# Patient Record
Sex: Male | Born: 1995 | Race: Black or African American | Hispanic: No | Marital: Single | Smoking: Current every day smoker
Health system: Southern US, Community
[De-identification: ages and names within clinical notes are randomized; demographics above are authoritative.]

## PROBLEM LIST (undated history)

## (undated) HISTORY — PX: FRACTURE SURGERY: SHX138

---

## 2016-08-17 HISTORY — PX: PILONIDAL CYST / SINUS EXCISION: SUR543

## 2020-07-30 ENCOUNTER — Emergency Department (HOSPITAL_COMMUNITY)
Admission: EM | Admit: 2020-07-30 | Discharge: 2020-07-30 | Disposition: A | Discharge status: Home / Self Care | Payer: BLUE CROSS/BLUE SHIELD | Attending: Emergency Medicine | Admitting: Emergency Medicine

## 2020-07-30 ENCOUNTER — Encounter (HOSPITAL_COMMUNITY): Payer: Self-pay | Admitting: Emergency Medicine

## 2020-07-30 ENCOUNTER — Other Ambulatory Visit: Payer: Self-pay

## 2020-07-30 DIAGNOSIS — L0231 Cutaneous abscess of buttock: Secondary | ICD-10-CM | POA: Diagnosis present

## 2020-07-30 DIAGNOSIS — L0501 Pilonidal cyst with abscess: Secondary | ICD-10-CM | POA: Insufficient documentation

## 2020-07-30 DIAGNOSIS — Z20822 Contact with and (suspected) exposure to covid-19: Secondary | ICD-10-CM | POA: Diagnosis not present

## 2020-07-30 DIAGNOSIS — Z5321 Procedure and treatment not carried out due to patient leaving prior to being seen by health care provider: Secondary | ICD-10-CM | POA: Diagnosis not present

## 2020-07-30 NOTE — ED Notes (Signed)
Patient called x1 for vitals recheck with no response 

## 2020-07-30 NOTE — ED Notes (Signed)
Patient called x 2 with no response. 

## 2020-07-30 NOTE — ED Triage Notes (Signed)
Patient arrived with EMS from home reports worsening abscess at upper buttocks with swelling and drainage onset last week .

## 2020-07-31 ENCOUNTER — Emergency Department (HOSPITAL_COMMUNITY): Payer: BLUE CROSS/BLUE SHIELD

## 2020-07-31 ENCOUNTER — Other Ambulatory Visit: Payer: Self-pay

## 2020-07-31 ENCOUNTER — Ambulatory Visit (HOSPITAL_COMMUNITY)
Admission: EM | Admit: 2020-07-31 | Discharge: 2020-07-31 | Disposition: A | Discharge status: Home / Self Care | Payer: BLUE CROSS/BLUE SHIELD | Attending: Emergency Medicine | Admitting: Emergency Medicine

## 2020-07-31 ENCOUNTER — Emergency Department (HOSPITAL_COMMUNITY): Payer: BLUE CROSS/BLUE SHIELD | Admitting: Anesthesiology

## 2020-07-31 ENCOUNTER — Encounter (HOSPITAL_COMMUNITY): Payer: Self-pay | Admitting: Emergency Medicine

## 2020-07-31 ENCOUNTER — Encounter (HOSPITAL_COMMUNITY)
Admission: EM | Disposition: A | Discharge status: Home / Self Care | Payer: Self-pay | Source: Home / Self Care | Attending: Emergency Medicine

## 2020-07-31 DIAGNOSIS — L0591 Pilonidal cyst without abscess: Secondary | ICD-10-CM | POA: Diagnosis not present

## 2020-07-31 DIAGNOSIS — L0501 Pilonidal cyst with abscess: Secondary | ICD-10-CM

## 2020-07-31 HISTORY — PX: PILONIDAL CYST EXCISION: SHX744

## 2020-07-31 LAB — CBC WITH DIFFERENTIAL/PLATELET
Abs Immature Granulocytes: 0.02 10*3/uL (ref 0.00–0.07)
Basophils Absolute: 0 10*3/uL (ref 0.0–0.1)
Basophils Relative: 0 %
Eosinophils Absolute: 0.1 10*3/uL (ref 0.0–0.5)
Eosinophils Relative: 1 %
HCT: 46.7 % (ref 39.0–52.0)
Hemoglobin: 15.2 g/dL (ref 13.0–17.0)
Immature Granulocytes: 0 %
Lymphocytes Relative: 20 %
Lymphs Abs: 1.8 10*3/uL (ref 0.7–4.0)
MCH: 29.6 pg (ref 26.0–34.0)
MCHC: 32.5 g/dL (ref 30.0–36.0)
MCV: 90.9 fL (ref 80.0–100.0)
Monocytes Absolute: 0.6 10*3/uL (ref 0.1–1.0)
Monocytes Relative: 7 %
Neutro Abs: 6.4 10*3/uL (ref 1.7–7.7)
Neutrophils Relative %: 72 %
Platelets: 269 10*3/uL (ref 150–400)
RBC: 5.14 MIL/uL (ref 4.22–5.81)
RDW: 12.4 % (ref 11.5–15.5)
WBC: 9 10*3/uL (ref 4.0–10.5)
nRBC: 0 % (ref 0.0–0.2)

## 2020-07-31 LAB — COMPREHENSIVE METABOLIC PANEL
ALT: 21 U/L (ref 0–44)
AST: 19 U/L (ref 15–41)
Albumin: 4.5 g/dL (ref 3.5–5.0)
Alkaline Phosphatase: 45 U/L (ref 38–126)
Anion gap: 8 (ref 5–15)
BUN: 10 mg/dL (ref 6–20)
CO2: 29 mmol/L (ref 22–32)
Calcium: 9.3 mg/dL (ref 8.9–10.3)
Chloride: 99 mmol/L (ref 98–111)
Creatinine, Ser: 0.99 mg/dL (ref 0.61–1.24)
GFR, Estimated: >60 mL/min (ref >60)
Glucose, Bld: 105 mg/dL — ABNORMAL HIGH (ref 70–99)
Potassium: 3.8 mmol/L (ref 3.5–5.1)
Sodium: 136 mmol/L (ref 135–145)
Total Bilirubin: 0.7 mg/dL (ref 0.3–1.2)
Total Protein: 8 g/dL (ref 6.5–8.1)

## 2020-07-31 LAB — RESP PANEL BY RT-PCR (FLU A&B, COVID) ARPGX2
Influenza A by PCR: NEGATIVE
Influenza B by PCR: NEGATIVE
SARS Coronavirus 2 by RT PCR: NEGATIVE

## 2020-07-31 SURGERY — EXCISION, SIMPLE PILONIDAL CYST
Anesthesia: General

## 2020-07-31 MED ORDER — CHLORHEXIDINE GLUCONATE CLOTH 2 % EX PADS: 6.0000 | MEDICATED_PAD | Freq: Once | CUTANEOUS | Status: DC

## 2020-07-31 MED ORDER — IOHEXOL 300 MG/ML  SOLN
100.0000 mL | Freq: Once | INTRAMUSCULAR | Status: AC | PRN
  Administered 2020-07-31: 04:00:00 100 mL via INTRAVENOUS

## 2020-07-31 MED ORDER — PROPOFOL 10 MG/ML IV BOLUS
INTRAVENOUS | Status: AC
  Filled 2020-07-31: qty 20

## 2020-07-31 MED ORDER — SODIUM CHLORIDE 0.9 % IR SOLN
Status: DC | PRN
  Administered 2020-07-31: 1000 mL

## 2020-07-31 MED ORDER — ONDANSETRON HCL 4 MG/2ML IJ SOLN
INTRAMUSCULAR | Status: AC
  Filled 2020-07-31: qty 2

## 2020-07-31 MED ORDER — MIDAZOLAM HCL 5 MG/5ML IJ SOLN
INTRAMUSCULAR | Status: DC | PRN
  Administered 2020-07-31: 2 mg via INTRAVENOUS

## 2020-07-31 MED ORDER — LIDOCAINE HCL (CARDIAC) PF 50 MG/5ML IV SOSY
PREFILLED_SYRINGE | INTRAVENOUS | Status: DC | PRN
  Administered 2020-07-31: 60 mg via INTRAVENOUS

## 2020-07-31 MED ORDER — EPHEDRINE 5 MG/ML INJ
INTRAVENOUS | Status: AC
  Filled 2020-07-31: qty 10

## 2020-07-31 MED ORDER — LACTATED RINGERS IV SOLN: INTRAVENOUS | Status: DC

## 2020-07-31 MED ORDER — FENTANYL CITRATE (PF) 250 MCG/5ML IJ SOLN
INTRAMUSCULAR | Status: AC
  Filled 2020-07-31: qty 5

## 2020-07-31 MED ORDER — HYDROGEN PEROXIDE 3 % EX SOLN
CUTANEOUS | Status: DC | PRN
  Administered 2020-07-31: 1

## 2020-07-31 MED ORDER — CHLORHEXIDINE GLUCONATE 0.12 % MT SOLN: 15.0000 mL | Freq: Once | OROMUCOSAL | Status: DC

## 2020-07-31 MED ORDER — POVIDONE-IODINE 10 % EX SOLN
CUTANEOUS | Status: AC
  Filled 2020-07-31: qty 15

## 2020-07-31 MED ORDER — BUPIVACAINE LIPOSOME 1.3 % IJ SUSP
INTRAMUSCULAR | Status: AC
  Filled 2020-07-31: qty 20

## 2020-07-31 MED ORDER — KETOROLAC TROMETHAMINE 30 MG/ML IJ SOLN: 30.0000 mg | Freq: Once | INTRAMUSCULAR | Status: DC

## 2020-07-31 MED ORDER — ONDANSETRON HCL 4 MG/2ML IJ SOLN
INTRAMUSCULAR | Status: DC | PRN
  Administered 2020-07-31: 4 mg via INTRAVENOUS

## 2020-07-31 MED ORDER — ORAL CARE MOUTH RINSE: 15.0000 mL | Freq: Once | OROMUCOSAL | Status: DC

## 2020-07-31 MED ORDER — HYDROMORPHONE HCL 1 MG/ML IJ SOLN: 0.2500 mg | INTRAMUSCULAR | Status: DC | PRN

## 2020-07-31 MED ORDER — LIDOCAINE-EPINEPHRINE (PF) 1 %-1:200000 IJ SOLN
10.0000 mL | Freq: Once | INTRAMUSCULAR | Status: AC
  Administered 2020-07-31: 10 mL via INTRADERMAL
  Filled 2020-07-31: qty 30

## 2020-07-31 MED ORDER — FENTANYL CITRATE (PF) 100 MCG/2ML IJ SOLN
INTRAMUSCULAR | Status: DC | PRN
  Administered 2020-07-31 (×4): 50 ug via INTRAVENOUS
  Administered 2020-07-31 (×2): 25 ug via INTRAVENOUS

## 2020-07-31 MED ORDER — OXYCODONE HCL 5 MG PO TABS: 5.0000 mg | ORAL_TABLET | ORAL | 0 refills | Status: AC | PRN

## 2020-07-31 MED ORDER — ONDANSETRON HCL 4 MG PO TABS: 4.0000 mg | ORAL_TABLET | Freq: Three times a day (TID) | ORAL | 1 refills | Status: AC | PRN

## 2020-07-31 MED ORDER — EPHEDRINE SULFATE 50 MG/ML IJ SOLN
INTRAMUSCULAR | Status: DC | PRN
  Administered 2020-07-31 (×2): 10 mg via INTRAVENOUS

## 2020-07-31 MED ORDER — MIDAZOLAM HCL 2 MG/2ML IJ SOLN
INTRAMUSCULAR | Status: AC
  Filled 2020-07-31: qty 2

## 2020-07-31 MED ORDER — DOCUSATE SODIUM 100 MG PO CAPS: 100.0000 mg | ORAL_CAPSULE | Freq: Two times a day (BID) | ORAL | 2 refills | Status: AC | PRN

## 2020-07-31 MED ORDER — LIDOCAINE HCL (PF) 2 % IJ SOLN
INTRAMUSCULAR | Status: AC
  Filled 2020-07-31: qty 10

## 2020-07-31 MED ORDER — MORPHINE SULFATE (PF) 2 MG/ML IV SOLN
2.0000 mg | INTRAVENOUS | Status: DC | PRN
Start: 2020-07-31 — End: 2020-07-31
  Administered 2020-07-31: 2 mg via INTRAVENOUS
  Filled 2020-07-31: qty 1

## 2020-07-31 MED ORDER — MORPHINE SULFATE (PF) 4 MG/ML IV SOLN
4.0000 mg | Freq: Once | INTRAVENOUS | Status: AC
Start: 2020-07-31 — End: 2020-07-31
  Administered 2020-07-31: 02:00:00 4 mg via INTRAMUSCULAR
  Filled 2020-07-31: qty 1

## 2020-07-31 MED ORDER — DEXAMETHASONE SODIUM PHOSPHATE 10 MG/ML IJ SOLN
INTRAMUSCULAR | Status: AC
  Filled 2020-07-31: qty 1

## 2020-07-31 MED ORDER — ONDANSETRON HCL 4 MG/2ML IJ SOLN: 4.0000 mg | Freq: Once | INTRAMUSCULAR | Status: DC | PRN

## 2020-07-31 MED ORDER — BUPIVACAINE LIPOSOME 1.3 % IJ SUSP
INTRAMUSCULAR | Status: DC | PRN
  Administered 2020-07-31: 20 mL

## 2020-07-31 MED ORDER — PROPOFOL 10 MG/ML IV BOLUS
INTRAVENOUS | Status: DC | PRN
  Administered 2020-07-31: 200 mg via INTRAVENOUS

## 2020-07-31 MED ORDER — DEXAMETHASONE SODIUM PHOSPHATE 4 MG/ML IJ SOLN
INTRAMUSCULAR | Status: DC | PRN
  Administered 2020-07-31: 10 mg via INTRAVENOUS

## 2020-07-31 MED ORDER — DOXYCYCLINE HYCLATE 50 MG PO CAPS: 50.0000 mg | ORAL_CAPSULE | Freq: Two times a day (BID) | ORAL | 0 refills | Status: AC

## 2020-07-31 SURGICAL SUPPLY — 32 items
CANNULA VESSEL 3MM 2 BLNT TIP (CANNULA) ×6 IMPLANT
CLOTH BEACON ORANGE TIMEOUT ST (SAFETY) ×3 IMPLANT
COVER LIGHT HANDLE STERIS (MISCELLANEOUS) ×6 IMPLANT
COVER WAND RF STERILE (DRAPES) ×3 IMPLANT
ELECT REM PT RETURN 9FT ADLT (ELECTROSURGICAL) ×3
ELECTRODE REM PT RTRN 9FT ADLT (ELECTROSURGICAL) ×1 IMPLANT
GAUZE PACKING 2X5 YD STRL (GAUZE/BANDAGES/DRESSINGS) ×3 IMPLANT
GAUZE SPONGE 4X4 12PLY STRL (GAUZE/BANDAGES/DRESSINGS) ×6 IMPLANT
GLOVE BIO SURGEON STRL SZ 6.5 (GLOVE) ×2 IMPLANT
GLOVE BIO SURGEONS STRL SZ 6.5 (GLOVE) ×1
GLOVE BIOGEL PI IND STRL 6.5 (GLOVE) ×1 IMPLANT
GLOVE BIOGEL PI IND STRL 7.0 (GLOVE) ×2 IMPLANT
GLOVE BIOGEL PI INDICATOR 6.5 (GLOVE) ×2
GLOVE BIOGEL PI INDICATOR 7.0 (GLOVE) ×4
GOWN STRL REUS W/TWL LRG LVL3 (GOWN DISPOSABLE) ×6 IMPLANT
KIT TURNOVER KIT A (KITS) ×3 IMPLANT
MANIFOLD NEPTUNE II (INSTRUMENTS) ×3 IMPLANT
NEEDLE HYPO 21X1.5 SAFETY (NEEDLE) ×3 IMPLANT
NS IRRIG 1000ML POUR BTL (IV SOLUTION) ×3 IMPLANT
PACK MINOR (CUSTOM PROCEDURE TRAY) ×3 IMPLANT
PAD ABD 5X9 TENDERSORB (GAUZE/BANDAGES/DRESSINGS) ×6 IMPLANT
PAD ARMBOARD 7.5X6 YLW CONV (MISCELLANEOUS) ×3 IMPLANT
PENCIL SMOKE EVACUATOR (MISCELLANEOUS) ×3 IMPLANT
SET BASIN LINEN APH (SET/KITS/TRAYS/PACK) ×3 IMPLANT
SPONGE LAP 18X18 RF (DISPOSABLE) ×3 IMPLANT
SUT ETHILON 3 0 FSL (SUTURE) IMPLANT
SUT PROLENE 2 0 FS (SUTURE) IMPLANT
SWAB CULTURE ESWAB REG 1ML (MISCELLANEOUS) ×3 IMPLANT
SWAB CULTURE LIQ STUART DBL (MISCELLANEOUS) ×3 IMPLANT
SYR 20ML LL LF (SYRINGE) ×6 IMPLANT
SYR CONTROL 10ML LL (SYRINGE) ×6 IMPLANT
TOWEL OR 17X26 4PK STRL BLUE (TOWEL DISPOSABLE) ×3 IMPLANT

## 2020-07-31 NOTE — Anesthesia Postprocedure Evaluation (Signed)
Anesthesia Post Note  Patient: Kayhan Boardley  Procedure(s) Performed: CYST EXCISION PILONIDAL SIMPLE (N/A )  Patient location during evaluation: PACU Anesthesia Type: General Level of consciousness: awake, patient cooperative and responds to stimulation Pain management: pain level controlled Vital Signs Assessment: post-procedure vital signs reviewed and stable Respiratory status: spontaneous breathing, respiratory function stable and nonlabored ventilation Cardiovascular status: blood pressure returned to baseline and stable Postop Assessment: no headache and no backache Anesthetic complications: no   No complications documented.   Last Vitals:  Vitals:   07/31/20 0748 07/31/20 0935  BP: 127/71 114/71  Pulse: 64 73  Resp: 18 12  Temp: (!) 36.4 C 36.8 C  SpO2: 97% 100%    Last Pain:  Vitals:   07/31/20 0935  TempSrc: Oral  PainSc: 10-Worst pain ever                 Brynda Peon

## 2020-07-31 NOTE — ED Triage Notes (Signed)
Pt c/o abscess to buttocks that has drainage and swelling for the past week.

## 2020-07-31 NOTE — H&P (Signed)
[facility] History and Physical  Reason for Referral: Infected pilonidal cyst  Referring Physician:  Dr. Lynelle DoctorKnapp  Chief Complaint    Abscess      Shaun Cox is a 24 y.o. male.  HPI:  Shaun Cox is a 24 yo otherwise healthy patient who comes in with pain and drainage in the gluteal cleft area. He has prior history of pilonidal disease and prior infections requiring drainage and excision in the operating room about 3 years ago in [place]. He reports that he had a large 9cm open wound that had to be packed and took months to heal.  He has not had issues since that time but started having pain yesterday.    Dr. Lynelle DoctorKnapp attempted an I&D at the bedside but only got out blood. CT demonstrates a collection midline just above the coccyx and significant inflammatory stranding superior.  He denies any fevers or chills.    History reviewed. No pertinent past medical history.  Past Surgical History:  Procedure Laterality Date  . PILONIDAL CYST / SINUS EXCISION  2018   [place]     Family History  Problem Relation Age of Onset  . Hypertension Mother     Social History   Tobacco Use  . Smoking status: Never Smoker  . Smokeless tobacco: Never Used  Substance Use Topics  . Alcohol use: Never  . Drug use: Never    Medications: I have reviewed the patient's current medications. Current Facility-Administered Medications  Medication Dose Route Frequency Provider Last Rate Last Admin  . morphine 2 MG/ML injection 2 mg  2 mg Intravenous Q2H PRN Lucretia RoersBridges, Maximiliano Cromartie C, MD   2 mg at 07/31/20 0809  . povidone-iodine (BETADINE) 10 % external solution            No current outpatient medications on file.   Allergies  Allergen Reactions  . Amoxicillin       ROS:  A comprehensive review of systems was negative except for: Genitourinary: positive for pain and drainage from pilonidal cyst   Blood pressure 127/71, pulse 64, temperature (!) 97.5 F (36.4 C), temperature  source Oral, resp. rate 18, height 5\' 9"  (1.753 m), weight 97.5 kg, SpO2 97 %. Physical Exam Vitals reviewed.  Constitutional:      Appearance: He is normal weight.  HENT:     Head: Normocephalic.     Nose: Nose normal.     Mouth/Throat:     Mouth: Mucous membranes are moist.  Eyes:     Extraocular Movements: Extraocular movements intact.  Cardiovascular:     Rate and Rhythm: Normal rate and regular rhythm.  Pulmonary:     Effort: Pulmonary effort is normal.     Breath sounds: Normal breath sounds.  Abdominal:     General: There is no distension.     Palpations: Abdomen is soft.     Tenderness: There is no abdominal tenderness.  Genitourinary:    Comments: Gluteal cleft region with hair and tufts of air in pits consistent with recurrent pilonidal, tufts removed and demonstrated to girlfriend, swollen and tender area midline just above the remaining pits that are about 4cm from the anal verge  Musculoskeletal:        General: Normal range of motion.     Cervical back: Normal range of motion.  Skin:    General: Skin is warm.  Neurological:     General: No focal deficit present.     Mental Status: He is alert and oriented to person,  place, and time.  Psychiatric:        Mood and Affect: Mood normal.        Behavior: Behavior normal.        Thought Content: Thought content normal.        Judgment: Judgment normal.     Results: Results for orders placed or performed during the hospital encounter of 07/31/20 (from the past 48 hour(s))  Comprehensive metabolic panel     Status: Abnormal   Collection Time: 07/31/20  3:52 AM  Result Value Ref Range   Sodium 136 135 - 145 mmol/L   Potassium 3.8 3.5 - 5.1 mmol/L   Chloride 99 98 - 111 mmol/L   CO2 29 22 - 32 mmol/L   Glucose, Bld 105 (H) 70 - 99 mg/dL    Comment: Glucose reference range applies only to samples taken after fasting for at least 8 hours.   BUN 10 6 - 20 mg/dL   Creatinine, Ser 2.99 0.61 - 1.24 mg/dL   Calcium  9.3 8.9 - 24.2 mg/dL   Total Protein 8.0 6.5 - 8.1 g/dL   Albumin 4.5 3.5 - 5.0 g/dL   AST 19 15 - 41 U/L   ALT 21 0 - 44 U/L   Alkaline Phosphatase 45 38 - 126 U/L   Total Bilirubin 0.7 0.3 - 1.2 mg/dL   GFR, Estimated >68 >34 mL/min    Comment: (NOTE) Calculated using the CKD-EPI Creatinine Equation (2021)    Anion gap 8 5 - 15    Comment: Performed at [facility], [address], [place], [place] [address]  CBC with Differential     Status: None   Collection Time: 07/31/20  3:52 AM  Result Value Ref Range   WBC 9.0 4.0 - 10.5 K/uL   RBC 5.14 4.22 - 5.81 MIL/uL   Hemoglobin 15.2 13.0 - 17.0 g/dL   HCT 29.7 98.9 - 21.1 %   MCV 90.9 80.0 - 100.0 fL   MCH 29.6 26.0 - 34.0 pg   MCHC 32.5 30.0 - 36.0 g/dL   RDW 94.1 74.0 - 81.4 %   Platelets 269 150 - 400 K/uL   nRBC 0.0 0.0 - 0.2 %   Neutrophils Relative % 72 %   Neutro Abs 6.4 1.7 - 7.7 K/uL   Lymphocytes Relative 20 %   Lymphs Abs 1.8 0.7 - 4.0 K/uL   Monocytes Relative 7 %   Monocytes Absolute 0.6 0.1 - 1.0 K/uL   Eosinophils Relative 1 %   Eosinophils Absolute 0.1 0.0 - 0.5 K/uL   Basophils Relative 0 %   Basophils Absolute 0.0 0.0 - 0.1 K/uL   Immature Granulocytes 0 %   Abs Immature Granulocytes 0.02 0.00 - 0.07 K/uL    Comment: Performed at [facility], [address], [place], [place] [address]   Personally reviewed- midline 2cm fluid collection, significant stranding and inflammation from prior surgical excision  CT PELVIS W CONTRAST  Result Date: 07/31/2020 CLINICAL DATA:  24 year old male with buttocks, Peri rectal abscess with drainage and swelling for 1 week. Scanned prone. EXAM: CT PELVIS WITH CONTRAST TECHNIQUE: Multidetector CT imaging of the pelvis was performed using the standard protocol following the bolus administration of intravenous contrast. CONTRAST:  OMNIPAQUE IOHEXOL 300 MG/ML  SOLN COMPARISON:  None. FINDINGS: The patient was scanned prone. Urinary Tract: Kidneys not included. Delayed  postcontrast images through the pelvis are included and demonstrate normal contrast in the distal ureters and the urinary bladder. Ureteral jet  on the right (normal). Bowel:  Gluteal, perianal details are below. Otherwise negative visible large and small bowel, including normal appendix terminating in the right pelvis on series 2, image 17. Vascular/Lymphatic: Early phase images demonstrate patent and normal-appearing major arterial structures. On delayed images the pelvic central veins appear grossly patent. No lymphadenopathy. Reproductive: Negative; inguinal canals and scrotum appear to remain normal. Other:  No pelvic free fluid. Roughly 4 cm area of confluent inflammatory stranding in the gluteal cleft about midline. Smaller midline roughly 20 by 8 mm fluid collection in the midline at the level of the superior gluteal cleft (see sagittal series 5, image 65) with rim enhancement. This is approximately 7 cm above the anal verge, overlying the S5 and upper coccyx levels. No soft tissue gas. Perianal region remains within normal limits. Musculoskeletal: Lower sacrum and coccygeal segments appear intact without evidence of osteomyelitis. Benign bone islands in the proximal right femur. Negative visible lower lumbar spine. IMPRESSION: 1. Small, midline roughly 2 cm abscess at the level of the superior gluteal cleft, overlying the S5/coccyx level and about 7 cm cephalad to the anal verge. See sagittal image 65. 2. Surrounding 4 cm area of regional phlegmon. No soft tissue gas or evidence of underlying osteomyelitis. 3. Otherwise negative CT appearance of the pelvis. Electronically Signed   By: Odessa Fleming M.D.   On: 07/31/2020 04:52     Assessment & Plan:  Shaun Cox is a 24 y.o. 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   We discussed that pilonidal cysts/ sinuses are abnormal tissue under the skin that is prone to infections. It is more common in patients that are overweight, family  history of a pilonidal cyst, deep gluteal cleft and thicker body hair, especially in the region of the gluteal cleft. We discussed that the exact cause of the disease is unknown but could be related to ingrown hairs/ and inflammation in the area coupled with mechanical / shear forces. We discussed that these cysts and sinuses can be surgically removed but that they often recur.  Prior to surgery, options for management include keeping the area clean by removal of hair from the area to decrease bacterial load and prevent trapping of feces in the area, cleansing the area after bowel movements, and daily to twice daily showering as well as keeping the area dry and wicking away moisture from the area if needed with dry gauze replaced often.  Also discussed pulling hair from the pits as this is one cause of the sinus getting clogged and infected.    We discussed that surgery has a high risk of recurrence (as high as 50%). We discussed that is reserved for symptomatic people who fail medical management. We discussed that the surgery historically has consisted of removing large areas of tissue and that this leaves large wounds to heal or pack. The healing of this area and a wound can take months and the need for a reoperation is possible.  We discussed newer techniques for pilonidal cyst removal including the minimal invasive, "GIPS Procedure," which results in better cosmesis and decreased wound issues.   We discussed the risk of surgery including but not limited to bleeding, infection, open punch biopsy sites that are sometimes flushed with peroxide, possible need for reoperation due to cyst recurrence.    Discussed that I will drain the area and clean it out using the GIPS technique and hopefully prevent him from having a large wound this time. Discussed risk of  recurrence still. Discussed hair removal and showed girlfriend how to remove the tufts of hair.   COVID pending.  All questions were answered to the  satisfaction of the patient and family.   Lucretia Roers 07/31/2020, 8:38 AM

## 2020-07-31 NOTE — Progress Notes (Signed)
[facility]  Pilonidal cyst excised and abscess drained. Packing in place and will remove in office tomorrow. Discussed with Ms. Laural Benes who is going to come with him and see how to pack the area.   Patient to replace pads overnight as needed. Bring supplies to clinic. Take a pain pill when he arrives to clinic.  Algis Greenhouse, MD [facility] [address] [place], [place] [address] [phone] (office)

## 2020-07-31 NOTE — ED Notes (Signed)
Dr. Bridges at bedside 

## 2020-07-31 NOTE — Discharge Instructions (Signed)
Pilonidal Surgery Discharge Instructions:   Come to clinic tomorrow with [name] so I can show her how to pack the wound.  Bring packing and syringe/ catheter (supplies from hospital).  Take a pain pill when you arrive to clinic.   Expect some bloody drainage and change your pad as needed.  Keep a dry pad over the area, and replace as needed for drainage.   Pain Expectations and Narcotics: -After surgery you will have pain associated with your incisions and this is normal. The pain is muscular and nerve pain, and will get better with time. -You are encouraged and expected to take non narcotic medications like tylenol and ibuprofen (when able) to treat pain as multiple modalities can aid with pain treatment. -Narcotics are only used when pain is severe or there is breakthrough pain. -You are not expected to have a pain score of 0 after surgery, as we cannot prevent pain. A pain score of 3-4 that allows you to be functional, move, walk, and tolerate some activity is the goal. The pain will continue to improve over the days after surgery and is dependent on your surgery. -Due to [place] law, we are only able to give a certain amount of pain medication to treat post operative pain, and we only give additional narcotics on a patient by patient basis.  -For most laparoscopic surgery, studies have shown that the majority of patients only need 10-15 narcotic pills, and for open surgeries most patients only need 15-20.   -Having appropriate expectations of pain and knowledge of pain management with non narcotics is important as we do not want anyone to become addicted to narcotic pain medication.  -Using ice packs in the first 48 hours and heating pads after 48 hours, wearing an abdominal binder (when recommended), and using over the counter medications are all ways to help with pain management.   -Simple acts like meditation and mindfulness practices after surgery can also help with pain control and  research has proven the benefit of these practices.  Medication: Take tylenol and ibuprofen as needed for pain control, alternating every 4-6 hours.  Example:  Tylenol $Remove'1000mg'sgSTpEN$  @ 6am, 12noon, 6pm, 69midnight (Do not exceed $RemoveBe'4000mg'rwYjaqVBN$  of tylenol a day). Ibuprofen $RemoveBeforeD'800mg'BAuafEvroMTBFl$  @ 9am, 3pm, 9pm, 3am (Do not exceed $RemoveBe'3600mg'hzWmbvvuc$  of ibuprofen a day).  Take Roxicodone for breakthrough pain every 4 hours.  Take Colace for constipation related to narcotic pain medication. If you do not have a bowel movement in 2 days, take Miralax over the counter.  Drink plenty of water to also prevent constipation.   Contact Information: If you have questions or concerns, please call our office, [phone], Monday- Thursday 8AM-5PM and Friday 8AM-12Noon.  If it is after hours or on the weekend, please call [facility], [phone], and ask to speak to the surgeon on call for Dr. Constance Haw at [facility].     Pilonidal Cyst Drainage, Care After This sheet gives you information about how to care for yourself after your procedure. Your health care provider may also give you more specific instructions. If you have problems or questions, contact your health care provider. What can I expect after the procedure? After the procedure, it is common to have:  Pain that gets better when you take medicine.  Some fluid or blood coming from your wound. Follow these instructions at home: Medicines  Take over-the-counter and prescription medicines only as told by your health care provider.  If you were prescribed an antibiotic medicine, take it  as told by your health care provider. Do not stop taking the antibiotic even if you start to feel better. Lifestyle  Do not do activities that irritate or put pressure on your buttocks for about 2 weeks, or as long as told by your health care provider. These activities include bike riding, running, and anything that involves a twisting motion.  Do not sit for long periods at a time without  getting up to move around.  Sleep on your side instead of your back.  Avoid wearing tight underwear and tight pants. Bathing  Do not take baths or showers, swim, or use a hot tub until your health care provider approves. This depends on the type of wound you have from surgery.  While bathing, clean your buttocks area gently with soap and water.  After bathing: ? Pat the area dry with a soft, clean towel. ? Cover the area with a clean bandage (dressing), if told to by your health care provider. General instructions   If you are taking prescription pain medicine, take actions to prevent or treat constipation. Your health care provider may recommend that you: ? Drink enough fluid to keep your urine pale yellow. ? Eat foods that are high in fiber, such as fresh fruits and vegetables, whole grains, and beans. ? Limit foods that are high in fat and processed sugars, such as fried or sweet foods. ? Take an over-the-counter or prescription medicine for constipation.  You will need to have a caregiver help you manage wound care and dressing changes. Your caregiver should: ? Wash his or her hands with soap and water before changing your dressing. If soap and water are not available, your caregiver should use hand sanitizer. ? Check your wound every day for signs of infection, such as:  Redness, swelling, or more pain.  More fluid or blood.  Warmth.  Pus or a bad smell. ? Follow any additional instructions from your health care provider on how to care for your wound, such as wound cleaning, wound flushing (irrigation), or packing your wound with a dressing.  Keep all follow-up visits as told by your health care provider. This is important. If you had incision and drainage with wound packing:  Return to your health care provider as instructed to have your packing material changed or removed.  Keep the area dry until your packing has been removed.  After the packing has been removed,  you may start taking showers. If you had marsupialization:  You may start taking showers the day after surgery, or when your health care provider approves.  Remove your dressing before you shower, but let the water from the shower moisten your dressing before you remove it. This will make it easier to remove.  Ask your health care provider when you can stop using a dressing. If you had incision and drainage without wound packing:  Change your dressing as directed.  Leave stitches (sutures), skin glue, or adhesive strips in place. These skin closures may need to stay in place for 2 weeks or longer. If adhesive strip edges start to loosen and curl up, you may trim the loose edges. Do not remove adhesive strips completely unless your health care provider tells you to do that. Contact a health care provider if:  You have redness, swelling, or more pain around your wound.  You have more fluid or blood coming from your wound.  You have new bleeding from your wound.  Your wound feels warm to the touch.  There is pus or a bad smell coming from your wound.  You have pain that does not get better with medicine.  You have a fever or chills.  You have muscle aches.  You are dizzy.  You feel generally sick. Summary  After a procedure to drain a pilonidal cyst, it is common to have some fluid or blood coming from your wound.  If you were prescribed an antibiotic medicine, take it as told by your health care provider. Do not stop taking the antibiotic even if you start to feel better.  Return to your health care provider as instructed to have any packing material changed or removed. This information is not intended to replace advice given to you by your health care provider. Make sure you discuss any questions you have with your health care provider. Document Revised: 05/26/2018 Document Reviewed: 07/26/2017 Elsevier Patient Education  2020 [place] [facility], Adult,  Care After This sheet gives you information about how to care for yourself after your procedure. Your health care provider may also give you more specific instructions. If you have problems or questions, contact your health care provider. What can I expect after the procedure? After the procedure, the following side effects are common:  Pain or discomfort at the IV site.  Nausea.  Vomiting.  Sore throat.  Trouble concentrating.  Feeling cold or chills.  Weak or tired.  Sleepiness and fatigue.  Soreness and body aches. These side effects can affect parts of the body that were not involved in surgery. Follow these instructions at home:  For at least 24 hours after the procedure:  Have a responsible adult stay with you. It is important to have someone help care for you until you are awake and alert.  Rest as needed.  Do not: ? Participate in activities in which you could fall or become injured. ? Drive. ? Use heavy machinery. ? Drink alcohol. ? Take sleeping pills or medicines that cause drowsiness. ? Make important decisions or sign legal documents. ? Take care of children on your own. Eating and drinking  Follow any instructions from your health care provider about eating or drinking restrictions.  When you feel hungry, start by eating small amounts of foods that are soft and easy to digest (bland), such as toast. Gradually return to your regular diet.  Drink enough fluid to keep your urine pale yellow.  If you vomit, rehydrate by drinking water, juice, or clear broth. General instructions  If you have sleep apnea, surgery and certain medicines can increase your risk for breathing problems. Follow instructions from your health care provider about wearing your sleep device: ? Anytime you are sleeping, including during daytime naps. ? While taking prescription pain medicines, sleeping medicines, or medicines that make you drowsy.  Return to your normal activities as  told by your health care provider. Ask your health care provider what activities are safe for you.  Take over-the-counter and prescription medicines only as told by your health care provider.  If you smoke, do not smoke without supervision.  Keep all follow-up visits as told by your health care provider. This is important. Contact a health care provider if:  You have nausea or vomiting that does not get better with medicine.  You cannot eat or drink without vomiting.  You have pain that does not get better with medicine.  You are unable to pass urine.  You develop a skin rash.  You have a fever.  You have redness around your IV site that gets worse. Get help right away if:  You have difficulty breathing.  You have chest pain.  You have blood in your urine or stool, or you vomit blood. Summary  After the procedure, it is common to have a sore throat or nausea. It is also common to feel tired.  Have a responsible adult stay with you for the first 24 hours after general anesthesia. It is important to have someone help care for you until you are awake and alert.  When you feel hungry, start by eating small amounts of foods that are soft and easy to digest (bland), such as toast. Gradually return to your regular diet.  Drink enough fluid to keep your urine pale yellow.  Return to your normal activities as told by your health care provider. Ask your health care provider what activities are safe for you. This information is not intended to replace advice given to you by your health care provider. Make sure you discuss any questions you have with your health care provider. Document Revised: 08/06/2017 Document Reviewed: 03/19/2017 Elsevier Patient Education  2020 Elsevier Inc.   [place] THE [place] EXPAREL/BUPIVACAINE BRACELET UNTIL Sunday August 04, 2020. DO NOT USE ANY ADDITIONAL NUMBING MEDICATIONS WITHOUT CONSULTING A PHYSICIAN UNTIL AFTER SUNDAY  Bupivacaine  Liposomal Suspension for Injection What is this medicine? BUPIVACAINE LIPOSOMAL (bue PIV a kane LIP oh som al) is an anesthetic. It causes loss of feeling in the skin or other tissues. It is used to prevent and to treat pain from some procedures. This medicine may be used for other purposes; ask your health care provider or pharmacist if you have questions. COMMON BRAND NAME(S): EXPAREL What should I tell my health care provider before I take this medicine? They need to know if you have any of these conditions:  G6PD deficiency  heart disease  kidney disease  liver disease  low blood pressure  lung or breathing disease, like asthma  an unusual or allergic reaction to bupivacaine, other medicines, foods, dyes, or preservatives  pregnant or trying to get pregnant  breast-feeding How should I use this medicine? This medicine is for injection into the affected area. It is given by a health care professional in a hospital or clinic setting. Talk to your pediatrician regarding the use of this medicine in children. Special care may be needed. Overdosage: If you think you have taken too much of this medicine contact a poison control center or emergency room at once. NOTE: This medicine is only for you. Do not share this medicine with others. What if I miss a dose? This does not apply. What may interact with this medicine? This medicine may interact with the following medications:  acetaminophen  certain antibiotics like dapsone, nitrofurantoin, aminosalicylic acid, sulfonamides  certain medicines for seizures like phenobarbital, phenytoin, valproic acid  chloroquine  cyclophosphamide  flutamide  hydroxyurea  ifosfamide  metoclopramide  nitric oxide  nitroglycerin  nitroprusside  nitrous oxide  other local anesthetics like lidocaine, pramoxine, tetracaine  primaquine  quinine  rasburicase  sulfasalazine This list may not describe all possible interactions.  Give your health care provider a list of all the medicines, herbs, non-prescription drugs, or dietary supplements you use. Also tell them if you smoke, drink alcohol, or use illegal drugs. Some items may interact with your medicine. What should I watch for while using this medicine? Your condition will be monitored carefully while you are receiving this medicine. Be careful  to avoid injury while the area is numb, and you are not aware of pain. What side effects may I notice from receiving this medicine? Side effects that you should report to your doctor or health care professional as soon as possible:  allergic reactions like skin rash, itching or hives, swelling of the face, lips, or tongue  seizures  signs and symptoms of a dangerous change in heartbeat or heart rhythm like chest pain; dizziness; fast, irregular heartbeat; palpitations; feeling faint or lightheaded; falls; breathing problems  signs and symptoms of methemoglobinemia such as pale, gray, or blue colored skin; headache; fast heartbeat; shortness of breath; feeling faint or lightheaded, falls; tiredness Side effects that usually do not require medical attention (report to your doctor or health care professional if they continue or are bothersome):  anxious  back pain  changes in taste  changes in vision  constipation  dizziness  fever  nausea, vomiting This list may not describe all possible side effects. Call your doctor for medical advice about side effects. You may report side effects to FDA at 1-800-FDA-1088. Where should I keep my medicine? This drug is given in a hospital or clinic and will not be stored at home. NOTE: This sheet is a summary. It may not cover all possible information. If you have questions about this medicine, talk to your doctor, pharmacist, or health care provider.  2020 Elsevier/Gold Standard (2019-05-16 10:48:23)    Docusate capsules What is this medicine? DOCUSATE (doc CUE sayt) is  stool softener. It helps prevent constipation and straining or discomfort associated with hard or dry stools. This medicine may be used for other purposes; ask your health care provider or pharmacist if you have questions. COMMON BRAND NAME(S): [employer] Stool Softner, Colace, Colace Clear, Correctol, D.O.S., DC, Doc-Q-Lace, DocuLace, Docusoft S, DOK, DOK Extra Strength, Dulcolax, Genasoft, Kao-Tin, Kaopectate Liqui-Gels, Phillips Stool Softener, Stool Softener, Stool Softner DC, Sulfolax, Sur-Q-Lax, Surfak, Uni-Ease What should I tell my health care provider before I take this medicine? They need to know if you have any of these conditions:  nausea or vomiting  severe constipation  stomach pain  sudden change in bowel habit lasting more than 2 weeks  an unusual or allergic reaction to docusate, other medicines, foods, dyes, or preservatives  pregnant or trying to get pregnant  breast-feeding How should I use this medicine? Take this medicine by mouth with a glass of water. Follow the directions on the label. Take your doses at regular intervals. Do not take your medicine more often than directed. Talk to your pediatrician regarding the use of this medicine in children. While this medicine may be prescribed for children as young as 2 years for selected conditions, precautions do apply. Overdosage: If you think you have taken too much of this medicine contact a poison control center or emergency room at once. NOTE: This medicine is only for you. Do not share this medicine with others. What if I miss a dose? If you miss a dose, take it as soon as you can. If it is almost time for your next dose, take only that dose. Do not take double or extra doses. What may interact with this medicine?  mineral oil This list may not describe all possible interactions. Give your health care provider a list of all the medicines, herbs, non-prescription drugs, or dietary supplements you use. Also tell them  if you smoke, drink alcohol, or use illegal drugs. Some items may interact with your medicine. What should I watch  for while using this medicine? Do not use for more than one week without advice from your doctor or health care professional. If your constipation returns, check with your doctor or health care professional. Drink plenty of water while taking this medicine. Drinking water helps decrease constipation. Stop using this medicine and contact your doctor or health care professional if you experience any rectal bleeding or do not have a bowel movement after use. These could be signs of a more serious condition. What side effects may I notice from receiving this medicine? Side effects that you should report to your doctor or health care professional as soon as possible:  allergic reactions like skin rash, itching or hives, swelling of the face, lips, or tongue Side effects that usually do not require medical attention (report to your doctor or health care professional if they continue or are bothersome):  diarrhea  stomach cramps  throat irritation This list may not describe all possible side effects. Call your doctor for medical advice about side effects. You may report side effects to FDA at 1-800-FDA-1088. Where should I keep my medicine? Keep out of the reach of children. Store at room temperature between 15 and 30 degrees C (59 and 86 degrees F). Throw away any unused medicine after the expiration date. NOTE: This sheet is a summary. It may not cover all possible information. If you have questions about this medicine, talk to your doctor, pharmacist, or health care provider.  2020 Elsevier/Gold Standard (2007-11-24 15:56:49)    Oxycodone tablets or capsules What is this medicine? OXYCODONE (ox i KOE done) is a pain reliever. It is used to treat moderate to severe pain. This medicine may be used for other purposes; ask your health care provider or pharmacist if you have  questions. COMMON BRAND NAME(S): Dazidox, Endocodone, Oxaydo, OXECTA, OxyIR, Percolone, Roxicodone, Roxybond What should I tell my health care provider before I take this medicine? They need to know if you have any of these conditions:  Addison's disease  brain tumor  head injury  heart disease  history of drug or alcohol abuse problem  if you often drink alcohol  kidney disease  liver disease  lung or breathing disease, like asthma  mental illness  pancreatic disease  seizures  thyroid disease  an unusual or allergic reaction to oxycodone, codeine, hydrocodone, morphine, other medicines, foods, dyes, or preservatives  pregnant or trying to get pregnant  breast-feeding How should I use this medicine? Take this medicine by mouth with a glass of water. Follow the directions on the prescription label. You can take it with or without food. If it upsets your stomach, take it with food. Take your medicine at regular intervals. Do not take it more often than directed. Do not stop taking except on your doctor's advice. Some brands of this medicine, like Oxecta, have special instructions. Ask your doctor or pharmacist if these directions are for you: Do not cut, crush or chew this medicine. Swallow only one tablet at a time. Do not wet, soak, or lick the tablet before you take it. A special MedGuide will be given to you by the pharmacist with each prescription and refill. Be sure to read this information carefully each time. Talk to your pediatrician regarding the use of this medicine in children. Special care may be needed. Overdosage: If you think you have taken too much of this medicine contact a poison control center or emergency room at once. NOTE: This medicine is only for you. Do not  share this medicine with others. What if I miss a dose? If you miss a dose, take it as soon as you can. If it is almost time for your next dose, take only that dose. Do not take double or extra  doses. What may interact with this medicine? This medicine may interact with the following medications:  alcohol  antihistamines for allergy, cough and cold  antiviral medicines for HIV or AIDS  atropine  certain antibiotics like clarithromycin, erythromycin, linezolid, rifampin  certain medicines for anxiety or sleep  certain medicines for bladder problems like oxybutynin, tolterodine  certain medicines for depression like amitriptyline, fluoxetine, sertraline  certain medicines for fungal infections like ketoconazole, itraconazole, voriconazole  certain medicines for migraine headache like almotriptan, eletriptan, frovatriptan, naratriptan, rizatriptan, sumatriptan, zolmitriptan  certain medicines for nausea or vomiting like dolasetron, ondansetron, palonosetron  certain medicines for Parkinson's disease like benztropine, trihexyphenidyl  certain medicines for seizures like phenobarbital, phenytoin, primidone  certain medicines for stomach problems like dicyclomine, hyoscyamine  certain medicines for travel sickness like scopolamine  diuretics  general anesthetics like halothane, isoflurane, methoxyflurane, propofol  ipratropium  local anesthetics like lidocaine, pramoxine, tetracaine  MAOIs like Carbex, Eldepryl, Marplan, Nardil, and Parnate  medicines that relax muscles for surgery  methylene blue  nilotinib  other narcotic medicines for pain or cough  phenothiazines like chlorpromazine, mesoridazine, prochlorperazine, thioridazine This list may not describe all possible interactions. Give your health care provider a list of all the medicines, herbs, non-prescription drugs, or dietary supplements you use. Also tell them if you smoke, drink alcohol, or use illegal drugs. Some items may interact with your medicine. What should I watch for while using this medicine? Tell your health care provider if your pain does not go away, if it gets worse, or if you have  new or a different type of pain. You may develop tolerance to this drug. Tolerance means that you will need a higher dose of the drug for pain relief. Tolerance is normal and is expected if you take this drug for a long time. There are different types of narcotic drugs (opioids) for pain. If you take more than one type at the same time, you may have more side effects. Give your health care provider a list of all drugs you use. He or she will tell you how much drug to take. Do not take more drug than directed. Get emergency help right away if you have problems breathing. Do not suddenly stop taking your drug because you may develop a severe reaction. Your body becomes used to the drug. This does NOT mean you are addicted. Addiction is a behavior related to getting and using a drug for a nonmedical reason. If you have pain, you have a medical reason to take pain drug. Your health care provider will tell you how much drug to take. If your health care provider wants you to stop the drug, the dose will be slowly lowered over time to avoid any side effects. Talk to your health care provider about naloxone and how to get it. Naloxone is an emergency drug used for an opioid overdose. An overdose can happen if you take too much opioid. It can also happen if an opioid is taken with some other drugs or substances, like alcohol. Know the symptoms of an overdose, like trouble breathing, unusually tired or sleepy, or not being able to respond or wake up. Make sure to tell caregivers and close contacts where it is stored. Make sure they  know how to use it. After naloxone is given, you must get emergency help right away. Naloxone is a temporary treatment. Repeat doses may be needed. You may get drowsy or dizzy. Do not drive, use machinery, or do anything that needs mental alertness until you know how this drug affects you. Do not stand up or sit up quickly, especially if you are an older patient. This reduces the risk of dizzy  or fainting spells. Alcohol may interfere with the effect of this drug. Avoid alcoholic drinks. This drug will cause constipation. If you do not have a bowel movement for 3 days, call your health care provider. Your mouth may get dry. Chewing sugarless gum or sucking hard candy and drinking plenty of water may help. Contact your health care provider if the problem does not go away or is severe. The tablet shell for some brands of this drug does not dissolve. This is normal. The tablet shell may appear whole in the stool. This is not a cause for concern. What side effects may I notice from receiving this medicine? Side effects that you should report to your doctor or health care professional as soon as possible:  allergic reactions like skin rash, itching or hives, swelling of the face, lips, or tongue  breathing problems  confusion  signs and symptoms of low blood pressure like dizziness; feeling faint or lightheaded, falls; unusually weak or tired  trouble passing urine or change in the amount of urine  trouble swallowing Side effects that usually do not require medical attention (report to your doctor or health care professional if they continue or are bothersome):  constipation  dry mouth  nausea, vomiting  tiredness This list may not describe all possible side effects. Call your doctor for medical advice about side effects. You may report side effects to FDA at 1-800-FDA-1088. Where should I keep my medicine? Keep out of the reach of children. This medicine can be abused. Keep your medicine in a safe place to protect it from theft. Do not share this medicine with anyone. Selling or giving away this medicine is dangerous and against the law. Store at room temperature between 15 and 30 degrees C (59 and 86 degrees F). Protect from light. Keep container tightly closed. This medicine may cause harm and death if it is taken by other adults, children, or pets. Return medicine that has  not been used to an official disposal site. Contact the DEA at 607-604-8759 or your city/county government to find a site. If you cannot return the medicine, flush it down the toilet. Do not use the medicine after the expiration date. NOTE: This sheet is a summary. It may not cover all possible information. If you have questions about this medicine, talk to your doctor, pharmacist, or health care provider.  2020 Elsevier/Gold Standard (2019-03-14 12:47:59)    Ondansetron oral dissolving tablet What is this medicine? ONDANSETRON (on DAN se tron) is used to treat nausea and vomiting caused by chemotherapy. It is also used to prevent or treat nausea and vomiting after surgery. This medicine may be used for other purposes; ask your health care provider or pharmacist if you have questions. COMMON BRAND NAME(S): Zofran ODT What should I tell my health care provider before I take this medicine? They need to know if you have any of these conditions:  heart disease  history of irregular heartbeat  liver disease  low levels of magnesium or potassium in the blood  an unusual or allergic reaction to  ondansetron, granisetron, other medicines, foods, dyes, or preservatives  pregnant or trying to get pregnant  breast-feeding How should I use this medicine? These tablets are made to dissolve in the mouth. Do not try to push the tablet through the foil backing. With dry hands, peel away the foil backing and gently remove the tablet. Place the tablet in the mouth and allow it to dissolve, then swallow. While you may take these tablets with water, it is not necessary to do so. Talk to your pediatrician regarding the use of this medicine in children. Special care may be needed. Overdosage: If you think you have taken too much of this medicine contact a poison control center or emergency room at once. NOTE: This medicine is only for you. Do not share this medicine with others. What if I miss a  dose? If you miss a dose, take it as soon as you can. If it is almost time for your next dose, take only that dose. Do not take double or extra doses. What may interact with this medicine? Do not take this medicine with any of the following medications:  apomorphine  certain medicines for fungal infections like fluconazole, itraconazole, ketoconazole, posaconazole, voriconazole  cisapride  dronedarone  pimozide  thioridazine This medicine may also interact with the following medications:  carbamazepine  certain medicines for depression, anxiety, or psychotic disturbances  fentanyl  linezolid  MAOIs like Carbex, Eldepryl, Marplan, Nardil, and Parnate  methylene blue (injected into a vein)  other medicines that prolong the QT interval (cause an abnormal heart rhythm) like dofetilide, ziprasidone  phenytoin  rifampicin  tramadol This list may not describe all possible interactions. Give your health care provider a list of all the medicines, herbs, non-prescription drugs, or dietary supplements you use. Also tell them if you smoke, drink alcohol, or use illegal drugs. Some items may interact with your medicine. What should I watch for while using this medicine? Check with your doctor or health care professional as soon as you can if you have any sign of an allergic reaction. What side effects may I notice from receiving this medicine? Side effects that you should report to your doctor or health care professional as soon as possible:  allergic reactions like skin rash, itching or hives, swelling of the face, lips, or tongue  breathing problems  confusion  dizziness  fast or irregular heartbeat  feeling faint or lightheaded, falls  fever and chills  loss of balance or coordination  seizures  sweating  swelling of the hands and feet  tightness in the chest  tremors  unusually weak or tired Side effects that usually do not require medical attention  (report to your doctor or health care professional if they continue or are bothersome):  constipation or diarrhea  headache This list may not describe all possible side effects. Call your doctor for medical advice about side effects. You may report side effects to FDA at 1-800-FDA-1088. Where should I keep my medicine? Keep out of the reach of children. Store between 2 and 30 degrees C (36 and 86 degrees F). Throw away any unused medicine after the expiration date. NOTE: This sheet is a summary. It may not cover all possible information. If you have questions about this medicine, talk to your doctor, pharmacist, or health care provider.  2020 Elsevier/Gold Standard (2018-07-26 07:14:10)   Doxycycline tablets or capsules What is this medicine? DOXYCYCLINE (dox i SYE kleen) is a tetracycline antibiotic. It kills certain bacteria or stops their  growth. It is used to treat many kinds of infections, like dental, skin, respiratory, and urinary tract infections. It also treats acne, Lyme disease, malaria, and certain sexually transmitted infections. This medicine may be used for other purposes; ask your health care provider or pharmacist if you have questions. COMMON BRAND NAME(S): Acticlate, Adoxa, Adoxa CK, Adoxa Pak, Adoxa TT, Alodox, Avidoxy, Doxal, LYMEPAK, Mondoxyne NL, Monodox, Morgidox 1x, Morgidox 1x Kit, Morgidox 2x, Morgidox 2x Kit, NutriDox, Ocudox, [place], [place], Vibra-Tabs, Vibramycin What should I tell my health care provider before I take this medicine? They need to know if you have any of these conditions:  liver disease  long exposure to sunlight like working outdoors  stomach problems like colitis  an unusual or allergic reaction to doxycycline, tetracycline antibiotics, other medicines, foods, dyes, or preservatives  pregnant or trying to get pregnant  breast-feeding How should I use this medicine? Take this medicine by mouth with a full glass of water. Follow the  directions on the prescription label. It is best to take this medicine without food, but if it upsets your stomach take it with food. Take your medicine at regular intervals. Do not take your medicine more often than directed. Take all of your medicine as directed even if you think you are better. Do not skip doses or stop your medicine early. Talk to your pediatrician regarding the use of this medicine in children. While this drug may be prescribed for selected conditions, precautions do apply. Overdosage: If you think you have taken too much of this medicine contact a poison control center or emergency room at once. NOTE: This medicine is only for you. Do not share this medicine with others. What if I miss a dose? If you miss a dose, take it as soon as you can. If it is almost time for your next dose, take only that dose. Do not take double or extra doses. What may interact with this medicine?  antacids  barbiturates  birth control pills  bismuth subsalicylate  carbamazepine  methoxyflurane  other antibiotics  phenytoin  vitamins that contain iron  warfarin This list may not describe all possible interactions. Give your health care provider a list of all the medicines, herbs, non-prescription drugs, or dietary supplements you use. Also tell them if you smoke, drink alcohol, or use illegal drugs. Some items may interact with your medicine. What should I watch for while using this medicine? Tell your doctor or health care professional if your symptoms do not improve. Do not treat diarrhea with over the counter products. Contact your doctor if you have diarrhea that lasts more than 2 days or if it is severe and watery. Do not take this medicine just before going to bed. It may not dissolve properly when you lay down and can cause pain in your throat. Drink plenty of fluids while taking this medicine to also help reduce irritation in your throat. This medicine can make you more  sensitive to the sun. Keep out of the sun. If you cannot avoid being in the sun, wear protective clothing and use sunscreen. Do not use sun lamps or tanning beds/booths. Birth control pills may not work properly while you are taking this medicine. Talk to your doctor about using an extra method of birth control. If you are being treated for a sexually transmitted infection, avoid sexual contact until you have finished your treatment. Your sexual partner may also need treatment. Avoid antacids, aluminum, calcium, magnesium, and iron products for 4 hours  before and 2 hours after taking a dose of this medicine. If you are using this medicine to prevent malaria, you should still protect yourself from contact with mosquitos. Stay in screened-in areas, use mosquito nets, keep your body covered, and use an insect repellent. What side effects may I notice from receiving this medicine? Side effects that you should report to your doctor or health care professional as soon as possible:  allergic reactions like skin rash, itching or hives, swelling of the face, lips, or tongue  difficulty breathing  fever  itching in the rectal or genital area  pain on swallowing  rash, fever, and swollen lymph nodes  redness, blistering, peeling or loosening of the skin, including inside the mouth  severe stomach pain or cramps  unusual bleeding or bruising  unusually weak or tired  yellowing of the eyes or skin Side effects that usually do not require medical attention (report to your doctor or health care professional if they continue or are bothersome):  diarrhea  loss of appetite  nausea, vomiting This list may not describe all possible side effects. Call your doctor for medical advice about side effects. You may report side effects to FDA at 1-800-FDA-1088. Where should I keep my medicine? Keep out of the reach of children. Store at room temperature, below 30 degrees C (86 degrees F). Protect from  light. Keep container tightly closed. Throw away any unused medicine after the expiration date. Taking this medicine after the expiration date can make you seriously ill. NOTE: This sheet is a summary. It may not cover all possible information. If you have questions about this medicine, talk to your doctor, pharmacist, or health care provider.  2020 Elsevier/Gold Standard (2018-11-03 13:44:53)

## 2020-07-31 NOTE — Anesthesia Preprocedure Evaluation (Signed)
Anesthesia Evaluation  Patient identified by MRN, date of birth, ID band Patient awake    Reviewed: Allergy & Precautions, H&P , NPO status , Patient's Chart, lab work & pertinent test results, reviewed documented beta blocker date and time   Airway Mallampati: II  TM Distance: >3 FB Neck ROM: full    Dental no notable dental hx.    Pulmonary neg pulmonary ROS, Current Smoker and Patient abstained from smoking.,    Pulmonary exam normal breath sounds clear to auscultation       Cardiovascular Exercise Tolerance: Good negative cardio ROS   Rhythm:regular Rate:Normal     Neuro/Psych negative neurological ROS  negative psych ROS   GI/Hepatic negative GI ROS, Neg liver ROS,   Endo/Other  negative endocrine ROS  Renal/GU negative Renal ROS  negative genitourinary   Musculoskeletal   Abdominal   Peds  Hematology negative hematology ROS (+)   Anesthesia Other Findings   Reproductive/Obstetrics negative OB ROS                             Anesthesia Physical Anesthesia Plan  ASA: II  Anesthesia Plan: General   Post-op Pain Management:    Induction:   PONV Risk Score and Plan: Propofol infusion  Airway Management Planned:   Additional Equipment:   Intra-op Plan:   Post-operative Plan:   Informed Consent: I have reviewed the patients History and Physical, chart, labs and discussed the procedure including the risks, benefits and alternatives for the proposed anesthesia with the patient or authorized representative who has indicated his/her understanding and acceptance.     Dental Advisory Given  Plan Discussed with: CRNA  Anesthesia Plan Comments:         Anesthesia Quick Evaluation

## 2020-07-31 NOTE — Op Note (Signed)
[facility] Operative Note  07/31/20  Preoperative Diagnosis: Infected Pilonidal Cyst with abscess     Postoperative Diagnosis: Same   Procedure(s) Performed:  Minimally Invasive Excision of Pilonidal Cyst and Sinus Track and drainage of abscess    Surgeon: Leatrice Jewels. Henreitta Leber, MD   Assistants: No qualified resident was available    Anesthesia: General endotracheal   Anesthesiologist: Windell Norfolk, MD    Specimens:  Cultures of abscess cavity    Estimated Blood Loss: Minimal   Blood Replacement: None    Complications: None   Wound Class: Dirty/ infected    Operative Indications: Mr. 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  We discussed his prior large excision and risk of recurrence with this disease process. Discussed minimally invasive excision and risk of bleeding, infection, recurrence.   Findings: Abscess cavity with purulence, sinus track and multiple pits with tufts of hair    Procedure: The patient was taken to the operating room and placed supine. General endotracheal anesthesia was induced. Intravenous antibiotics were not administered in order to get cultures. He was then placed in left lateral decubitus with pressure points padded on a bean bag. A JACHO approved timeout was performed. The gluteal cleft and buttocks were prepared and draped in the usual sterile fashion. The buttock were taped open to allow for adequate visualization.   On investigation, pits with hair were noted inferior to a bulging midline abscess cavity. Just with stretching the buttock open the abscess cavity opened up and purulence drained.  Using a 8 mm punch biopsy the abscess cavity was opened up further and cultures were obtained. It was cored out and the sinus track and all hair was removed with trephine and curette.  A total of 2 more pits were excised with the 6 mm punch biopsy inferior as I was unable to do  separate excises due to the proximity to each other.   The sinus track was clear of all cyst wall and hair. The wounds were irrigated with peroxide. All excess hair was removed from around the punch sites.  Xparel  was injected into the field.  Hemostasis was confirmed. Packing was placed due to the infection and amount of oozing in the cavity. An ABD and mesh underwear were placed after the patient was turned on their back.    All counts were correct at the end of the case. The patient was awakened from anesthesia and extubatedd without complication.  The patient went to the PACU in stable condition.   Algis Greenhouse, MD [facility] [address] [place], [place] [address] [phone] (office)

## 2020-07-31 NOTE — Transfer of Care (Signed)
Immediate Anesthesia Transfer of Care Note  Patient: Shaun Cox  Procedure(s) Performed: CYST EXCISION PILONIDAL SIMPLE (N/A )  Patient Location: PACU  Anesthesia Type:General  Level of Consciousness: drowsy, patient cooperative and responds to stimulation  Airway & Oxygen Therapy: Patient Spontanous Breathing  Post-op Assessment: Report given to RN, Post -op Vital signs reviewed and stable and Patient moving all extremities  Post vital signs: Reviewed and stable  Last Vitals:  Vitals Value Taken Time  BP    Temp    Pulse    Resp 11 07/31/20 1224  SpO2    Vitals shown include unvalidated device data.  Last Pain:  Vitals:   07/31/20 0935  TempSrc: Oral  PainSc: 10-Worst pain ever      Patients Stated Pain Goal: 7 (07/31/20 0935)  Complications: No complications documented.

## 2020-07-31 NOTE — Anesthesia Procedure Notes (Signed)
Procedure Name: LMA Insertion Date/Time: 07/31/2020 11:12 AM Performed by: Moshe Salisbury, CRNA Pre-anesthesia Checklist: Patient identified, Patient being monitored, Emergency Drugs available, Timeout performed and Suction available Patient Re-evaluated:Patient Re-evaluated prior to induction Oxygen Delivery Method: Circle System Utilized Preoxygenation: Pre-oxygenation with 100% oxygen Induction Type: IV induction Ventilation: Mask ventilation without difficulty LMA: LMA inserted LMA Size: 4.0 Number of attempts: 1 Placement Confirmation: positive ETCO2 and breath sounds checked- equal and bilateral

## 2020-07-31 NOTE — ED Provider Notes (Signed)
[facility] EMERGENCY DEPARTMENT Provider Note   CSN: [id number] Arrival date & time: 07/30/20  2337   Time seen 1:51 AM  History Chief Complaint  Patient presents with  . Abscess    Shaun Cox is a 24 y.o. male.  HPI   Patient states about 3 years ago he was living in [place] and he had a pilonidal cyst that was initially drained in the ED and then it he had to have a CT scan and then he was taken to the operating room.  He states he started noticing last week that he was having a another flareup.  He denies fever but states he has had some fatigue.  He did have some nausea and vomited once tonight.  He states it has been draining the past few days and his girlfriend describes it as pus and blood.  He denies any diarrhea.  PCP Patient, No Pcp Per   History reviewed. No pertinent past medical history.  There are no problems to display for this patient.   History reviewed. No pertinent surgical history.     History reviewed. No pertinent family history.  Social History   Tobacco Use  . Smoking status: Never Smoker  . Smokeless tobacco: Never Used  Substance Use Topics  . Alcohol use: Never  . Drug use: Never    Home Medications Prior to Admission medications   Not on File    Allergies    Amoxicillin  Review of Systems   Review of Systems  All other systems reviewed and are negative.   Physical Exam Updated Vital Signs BP 118/82   Pulse 61   Temp (!) 97.4 F (36.3 C) (Oral)   Resp 20   Ht 5\' 9"  (1.753 m)   Wt 97.5 kg   SpO2 100%   BMI 31.75 kg/m   Physical Exam Vitals and nursing note reviewed.  Constitutional:      General: He is not in acute distress.    Appearance: Normal appearance. He is normal weight. He is not ill-appearing or toxic-appearing.  HENT:     Head: Normocephalic.  Cardiovascular:     Rate and Rhythm: Normal rate.  Pulmonary:     Effort: Pulmonary effort is normal. No respiratory distress.  Genitourinary:     Comments: On exam of his buttock area he has a well-healed linear midline surgical scar in the lower sacral area consistent with prior pilonidal surgery.  I do not see an obvious place where it is draining.  When I palpate around the area there is some small area of induration of the upper right buttock that appears to be the area of most pain. Musculoskeletal:        General: Normal range of motion.  Skin:    General: Skin is warm and dry.  Neurological:     General: No focal deficit present.     Mental Status: He is alert and oriented to person, place, and time.     Cranial Nerves: No cranial nerve deficit.  Psychiatric:        Mood and Affect: Mood normal.        Behavior: Behavior normal.        Thought Content: Thought content normal.     ED Results / Procedures / Treatments   Labs (all labs ordered are listed, but only abnormal results are displayed) Labs Reviewed  COMPREHENSIVE METABOLIC PANEL - Abnormal; Notable for the following components:      Result Value  Glucose, Bld 105 (*)    All other components within normal limits  CBC WITH DIFFERENTIAL/PLATELET    EKG None  Radiology CT PELVIS W CONTRAST  Result Date: 07/31/2020 CLINICAL DATA:  24 year old male with buttocks, Peri rectal abscess with drainage and swelling for 1 week. Scanned prone. EXAM: CT PELVIS WITH CONTRAST TECHNIQUE: Multidetector CT imaging of the pelvis was performed using the standard protocol following the bolus administration of intravenous contrast. CONTRAST:  OMNIPAQUE IOHEXOL 300 MG/ML  SOLN COMPARISON:  None. FINDINGS: The patient was scanned prone. Urinary Tract: Kidneys not included. Delayed postcontrast images through the pelvis are included and demonstrate normal contrast in the distal ureters and the urinary bladder. Ureteral jet on the right (normal). Bowel:  Gluteal, perianal details are below. Otherwise negative visible large and small bowel, including normal appendix terminating in  the right pelvis on series 2, image 17. Vascular/Lymphatic: Early phase images demonstrate patent and normal-appearing major arterial structures. On delayed images the pelvic central veins appear grossly patent. No lymphadenopathy. Reproductive: Negative; inguinal canals and scrotum appear to remain normal. Other:  No pelvic free fluid. Roughly 4 cm area of confluent inflammatory stranding in the gluteal cleft about midline. Smaller midline roughly 20 by 8 mm fluid collection in the midline at the level of the superior gluteal cleft (see sagittal series 5, image 65) with rim enhancement. This is approximately 7 cm above the anal verge, overlying the S5 and upper coccyx levels. No soft tissue gas. Perianal region remains within normal limits. Musculoskeletal: Lower sacrum and coccygeal segments appear intact without evidence of osteomyelitis. Benign bone islands in the proximal right femur. Negative visible lower lumbar spine. IMPRESSION: 1. Small, midline roughly 2 cm abscess at the level of the superior gluteal cleft, overlying the S5/coccyx level and about 7 cm cephalad to the anal verge. See sagittal image 65. 2. Surrounding 4 cm area of regional phlegmon. No soft tissue gas or evidence of underlying osteomyelitis. 3. Otherwise negative CT appearance of the pelvis. Electronically Signed   By: Odessa Fleming M.D.   On: 07/31/2020 04:52    Procedures .Marland KitchenIncision and Drainage  Date/Time: 07/31/2020 3:38 AM Performed by: Devoria Albe, MD Authorized by: Devoria Albe, MD   Consent:    Consent obtained:  Verbal   Consent given by:  Patient Universal protocol:    Immediately prior to procedure, a time out was called: yes     Patient identity confirmed:  Verbally with patient and arm band Location:    Indications for incision and drainage: pilonidal abscess. Pre-procedure details:    Skin preparation:  Povidone-iodine Anesthesia:    Anesthesia method:  Local infiltration   Local anesthetic:  Lidocaine 1% WITH  epi Procedure details:    Incision types:  Single straight   Incision depth:  Subcutaneous   Wound management:  Probed and deloculated   Drainage:  Bloody   Drainage amount:  Scant   Wound treatment:  Wound left open Post-procedure details:    Procedure completion:  Tolerated well, no immediate complications Comments:     Patient was given morphine IM prior to procedure. I did not localize the abscess, I went and where the patient seemed most tender and had the most induration but now I am wondering if that was just scar tissue.   (including critical care time)  Medications Ordered in ED Medications  povidone-iodine (BETADINE) 10 % external solution (has no administration in time range)  morphine 4 MG/ML injection 4 mg (4 mg Intramuscular  Given 07/31/20 0215)  lidocaine-EPINEPHrine (XYLOCAINE-EPINEPHrine) 1 %-1:200000 (PF) injection 10 mL (10 mLs Intradermal Given 07/31/20 0216)  iohexol (OMNIPAQUE) 300 MG/ML solution 100 mL (100 mLs Intravenous Contrast Given 07/31/20 0429)    ED Course  I have reviewed the triage vital signs and the nursing notes.  Pertinent labs & imaging results that were available during my care of the patient were reviewed by me and considered in my medical decision making (see chart for details).    MDM Rules/Calculators/A&P                          Patient has someone who can drive him home.  He was given morphine IM for pain.  My I&D did not find the abscess pocket.  We discussed doing CT scan and he is agreeable.  CT scan does show an abscess.  However I was unable to feel the abscess due to scar tissue from his prior surgeries.  I will discuss with surgery  5:50 AM Dr. Henreitta Leber, surgery will see patient this morning.   Final Clinical Impression(s) / ED Diagnoses Final diagnoses:  Pilonidal abscess    Rx / DC Orders  Disposition pending Dr Henreitta Leber evalution  Devoria Albe, MD, Concha Pyo, MD 07/31/20 [phone]

## 2020-08-01 ENCOUNTER — Ambulatory Visit (INDEPENDENT_AMBULATORY_CARE_PROVIDER_SITE_OTHER): Payer: BLUE CROSS/BLUE SHIELD | Admitting: General Surgery

## 2020-08-01 ENCOUNTER — Encounter: Payer: Self-pay | Admitting: General Surgery

## 2020-08-01 VITALS — BP 121/74 | HR 94 | Temp 98.2°F | Resp 16 | Ht 69.0 in | Wt 220.0 lb

## 2020-08-01 DIAGNOSIS — L0591 Pilonidal cyst without abscess: Secondary | ICD-10-CM

## 2020-08-01 NOTE — Progress Notes (Signed)
[facility] Note   HPI:  24 y.o. Male presents to clinic for post-op follow-up evaluation after pilonidal drainage and excision. Doing well but having pain. Some drainage. Has not gotten narcotics yet.   Cultures with growth.   Review of Systems:  Minor drainage Soreness/ pain All other review of systems: otherwise negative   Vital Signs:  BP 121/74   Pulse 94   Temp 98.2 F (36.8 C) (Oral)   Resp 16   Ht 5\' 9"  (1.753 m)   Wt 220 lb (99.8 kg)   SpO2 93%   BMI 32.49 kg/m    Physical Exam:  Physical Exam Vitals reviewed.  Genitourinary:    Comments: Packing removed, bloody, replaced, tender in area Neurological:     Mental Status: He is alert.      Assessment:  24 y.o. yo Male with infected pilonidal cyst s/p drainage and excision. Doing well but having pain with dressing change.  Plan:  Pack daily to twice daily with packing gauze and move to gauze with saline as it gets less bloody. Ok to do sitz baths but change packing after. Take doxycycline Take pain medication ibuprofen 800 mg every 6 hours and tylenol 1000mg  every 6 hours and roxicodone for breakthrough. Take roxicodone with dressing changes as needed. Call if needs refill of pain meds   Future Appointments  Date Time Provider Department Center  08/13/2020  9:30 AM , MD [facility] None     All of the above recommendations were discussed with the patient and patient's family, and all of patient's and family's questions were answered to their expressed satisfaction.  08/15/2020, MD [facility] [address] [facility] [place], [place] [phone] (office)

## 2020-08-01 NOTE — Patient Instructions (Signed)
Pack daily to twice daily with packing gauze and move to gauze with saline as it gets less bloody. Ok to do sitz baths but change packing after. Take doxycycline Take pain medication ibuprofen 800 mg every 6 hours and tylenol 1000mg  every 6 hours and roxicodone for breakthrough. Take roxicodone with dressing changes as needed.

## 2020-08-03 LAB — AEROBIC/ANAEROBIC CULTURE W GRAM STAIN (SURGICAL/DEEP WOUND)

## 2020-08-13 ENCOUNTER — Ambulatory Visit: Payer: BLUE CROSS/BLUE SHIELD | Admitting: General Surgery

## 2021-12-17 ENCOUNTER — Emergency Department
Admission: EM | Admit: 2021-12-17 | Discharge: 2021-12-17 | Disposition: A | Discharge status: Home / Self Care | Payer: BLUE CROSS/BLUE SHIELD | Attending: Emergency Medicine | Admitting: Emergency Medicine

## 2021-12-17 ENCOUNTER — Other Ambulatory Visit: Payer: Self-pay

## 2021-12-17 ENCOUNTER — Encounter: Payer: Self-pay | Admitting: *Deleted

## 2021-12-17 DIAGNOSIS — S6992XA Unspecified injury of left wrist, hand and finger(s), initial encounter: Secondary | ICD-10-CM | POA: Diagnosis present

## 2021-12-17 DIAGNOSIS — Y92000 Kitchen of unspecified non-institutional (private) residence as  the place of occurrence of the external cause: Secondary | ICD-10-CM | POA: Diagnosis not present

## 2021-12-17 DIAGNOSIS — Y93G3 Activity, cooking and baking: Secondary | ICD-10-CM | POA: Insufficient documentation

## 2021-12-17 DIAGNOSIS — S61211A Laceration without foreign body of left index finger without damage to nail, initial encounter: Secondary | ICD-10-CM | POA: Diagnosis not present

## 2021-12-17 DIAGNOSIS — W260XXA Contact with knife, initial encounter: Secondary | ICD-10-CM | POA: Diagnosis not present

## 2021-12-17 DIAGNOSIS — Z23 Encounter for immunization: Secondary | ICD-10-CM | POA: Diagnosis not present

## 2021-12-17 MED ORDER — TETANUS-DIPHTH-ACELL PERTUSSIS 5-2.5-18.5 LF-MCG/0.5 IM SUSY
0.5000 mL | PREFILLED_SYRINGE | Freq: Once | INTRAMUSCULAR | Status: AC
  Administered 2021-12-17: 0.5 mL via INTRAMUSCULAR

## 2021-12-17 MED ORDER — LIDOCAINE HCL (PF) 1 % IJ SOLN
10.0000 mL | Freq: Once | INTRAMUSCULAR | Status: AC
  Administered 2021-12-17: 10 mL
  Filled 2021-12-17: qty 10

## 2021-12-17 NOTE — Discharge Instructions (Signed)
Keep area clean and dry.  Watch for any signs of infection such as redness, pus or increased swelling.  You may take Tylenol or ibuprofen as needed for pain.  Elevate your hand to reduce swelling which will help with pain.  If your hanging your hand down it is more likely to swell which will increase her pain.  You can follow-up with your primary care, urgent care in approximately 10 to 12 days to have your sutures removed. ?

## 2021-12-17 NOTE — ED Provider Notes (Signed)
? ?[facility] ?Provider Note ? ? ? Event Date/Time  ? First MD Initiated Contact with Patient 12/17/21 [phone]   ?  (approximate) ? ? ?History  ? ?Laceration ? ? ?HPI ? ?Shaun Cox is a 26 y.o. male   to the ED with laceration to his left index finger.  Patient states that he was using a knife to cut salmon in the kitchen.  He states that he cut himself with the same knife.  He is unaware of his last tetanus but believes it to be 2015.  Patient denies any medical history and smokes daily. ? ?  ? ? ?Physical Exam  ? ?Triage Vital Signs: ?ED Triage Vitals  ?Enc Vitals Group  ?   BP 12/17/21 0250 (!) 142/73  ?   Pulse Rate 12/17/21 0250 76  ?   Resp 12/17/21 0250 20  ?   Temp 12/17/21 0250 98.3 ?F (36.8 ?C)  ?   Temp Source 12/17/21 0250 Oral  ?   SpO2 12/17/21 0250 95 %  ?   Weight 12/17/21 0251 238 lb (108 kg)  ?   Height 12/17/21 0251 5\' 9"  (1.753 m)  ?   Head Circumference --   ?   Peak Flow --   ?   Pain Score 12/17/21 0250 8  ?   Pain Loc --   ?   Pain Edu? --   ?   Excl. in [place]? --   ? ? ?Most recent vital signs: ?Vitals:  ? 12/17/21 0250 12/17/21 0911  ?BP: (!) 142/73 140/70  ?Pulse: 76 74  ?Resp: 20 16  ?Temp: 98.3 ?F (36.8 ?C) 98.1 ?F (36.7 ?C)  ?SpO2: 95% 96%  ? ? ? ?General: Awake, no distress.  ?CV:  Good peripheral perfusion.  ?Resp:  Normal effort.  ?Abd:  No distention.  ?Other:  Left index finger distally volar aspect with laceration and bleeding controlled.  Flap laceration, superficial without foreign body noted.  Range of motion is unrestricted.  Motor or sensory function intact. ? ? ?ED Results / Procedures / Treatments  ? ?Labs ?(all labs ordered are listed, but only abnormal results are displayed) ?Labs Reviewed - No data to display ? ? ? ?PROCEDURES: ? ?Critical Care performed:  ? ?Marland Kitchen.Laceration Repair ? ?Date/Time: 12/17/2021 8:13 AM ?Performed by: Johnn Hai, PA-C ?Authorized by: Johnn Hai, PA-C  ? ?Consent:  ?  Consent obtained:  Verbal ?  Consent given by:   Patient ?  Risks, benefits, and alternatives were discussed: yes   ?  Risks discussed:  Infection, pain and poor wound healing ?Anesthesia:  ?  Anesthesia method:  Nerve block ?  Block needle gauge:  25 G ?  Block anesthetic:  Lidocaine 1% w/o epi ?  Block injection procedure:  Anatomic landmarks identified, introduced needle and negative aspiration for blood ?  Block outcome:  Anesthesia achieved ?Laceration details:  ?  Location:  Finger ?  Finger location:  L index finger ?  Length (cm):  1.5 ?Treatment:  ?  Area cleansed with:  Saline ?  Amount of cleaning:  Standard ?  Irrigation solution:  Sterile saline ?  Irrigation volume:  100 ml ?  Irrigation method:  Syringe and pressure wash ?  Visualized foreign bodies/material removed: no   ?Skin repair:  ?  Repair method:  Sutures ?  Suture size:  5-0 ?  Suture material:  Nylon ?  Suture technique:  Simple interrupted ?  Number of sutures:  4 ?Approximation:  ?  Approximation:  Close ?Repair type:  ?  Repair type:  Simple ?Post-procedure details:  ?  Dressing:  Non-adherent dressing ? ? ?MEDICATIONS ORDERED IN ED: ?Medications  ?Tdap (BOOSTRIX) injection 0.5 mL (0.5 mLs Intramuscular Given 12/17/21 0733)  ?lidocaine (PF) (XYLOCAINE) 1 % injection 10 mL (10 mLs Infiltration Given by Other 12/17/21 0757)  ? ? ? ?IMPRESSION / MDM / ASSESSMENT AND PLAN / ED COURSE  ?I reviewed the triage vital signs and the nursing notes. ? ? ?Differential diagnosis includes, but is not limited to, laceration left index finger. ? ? ?26 year old male presents to the ED with laceration to his left index finger that occurred while he was cutting a piece of salmon with a knife in the kitchen.  Bleeding was controlled and patient needs a tetanus booster.  Digital block was done with complete anesthesia and patient tolerated suturing without any difficulty.  He was given information on how to care for his wound and that sutures should be removed in approximately 10 to 12 days.  He may take Tylenol  or ibuprofen as needed for pain and elevate as needed for swelling.  He is due follow-up with his PCP or urgent care if any signs of infection and also for suture removal. ? ? ?  ? ? ?FINAL CLINICAL IMPRESSION(S) / ED DIAGNOSES  ? ?Final diagnoses:  ?Laceration of left index finger without foreign body without damage to nail, initial encounter  ? ? ? ?Rx / DC Orders  ? ?ED Discharge Orders   ? ? None  ? ?  ? ? ? ?Note:  This document was prepared using Dragon voice recognition software and may include unintentional dictation errors. ?  ?Johnn Hai, PA-C ?12/17/21 1129 ? ?  ?Lavonia Drafts, MD ?12/17/21 1251 ? ?

## 2021-12-17 NOTE — ED Triage Notes (Signed)
Pt has laceration to left index finger.  Pt  cut self with a knife while cutting salmon in the kitchen.  Bleeding controlled with bandage.  Pt alert ?

## 2021-12-29 ENCOUNTER — Other Ambulatory Visit: Payer: Self-pay

## 2021-12-29 ENCOUNTER — Emergency Department
Admission: EM | Admit: 2021-12-29 | Discharge: 2021-12-29 | Disposition: A | Discharge status: Home / Self Care | Payer: BLUE CROSS/BLUE SHIELD | Attending: Emergency Medicine | Admitting: Emergency Medicine

## 2021-12-29 ENCOUNTER — Encounter: Payer: Self-pay | Admitting: Emergency Medicine

## 2021-12-29 DIAGNOSIS — Z4802 Encounter for removal of sutures: Secondary | ICD-10-CM | POA: Insufficient documentation

## 2021-12-29 NOTE — ED Provider Notes (Signed)
? ?  [facility] ?Provider Note ? ? ? Event Date/Time  ? First MD Initiated Contact with Patient 12/29/21 1210   ?  (approximate) ? ? ?History  ? ?Suture / Staple Removal ? ? ?HPI ? ?Shaun Cox is a 26 y.o. male here for suture removal, had laceration repair to the finger, no issue ?  ? ? ?Physical Exam  ? ?Triage Vital Signs: ?ED Triage Vitals  ?Enc Vitals Group  ?   BP 12/29/21 1207 138/78  ?   Pulse Rate 12/29/21 1207 85  ?   Resp 12/29/21 1207 18  ?   Temp 12/29/21 1207 98.2 ?F (36.8 ?C)  ?   Temp Source 12/29/21 1207 Oral  ?   SpO2 12/29/21 1207 98 %  ?   Weight --   ?   Height --   ?   Head Circumference --   ?   Peak Flow --   ?   Pain Score 12/29/21 1204 0  ?   Pain Loc --   ?   Pain Edu? --   ?   Excl. in [place]? --   ? ? ?Most recent vital signs: ?Vitals:  ? 12/29/21 1207  ?BP: 138/78  ?Pulse: 85  ?Resp: 18  ?Temp: 98.2 ?F (36.8 ?C)  ?SpO2: 98%  ? ? ? ?General: Awake, no distress.  ?CV:  Good peripheral perfusion.  ?Resp:  Normal effort.  ?Abd:  No distention.  ?Other:  Fingers well-healed, f 4 sutures noted, no infection or dehiscence ? ? ?ED Results / Procedures / Treatments  ? ?Labs ?(all labs ordered are listed, but only abnormal results are displayed) ?Labs Reviewed - No data to display ? ? ?EKG ? ? ? ? ?RADIOLOGY ? ? ? ? ?PROCEDURES: ? ?Critical Care performed:  ? ?Procedures ? ? ?MEDICATIONS ORDERED IN ED: ?Medications - No data to display ? ? ?IMPRESSION / MDM / ASSESSMENT AND PLAN / ED COURSE  ?I reviewed the triage vital signs and the nursing notes. ? ?Here for suture removal, 4 sutures removed without difficulty ? ? ? ? ? ?  ? ? ?FINAL CLINICAL IMPRESSION(S) / ED DIAGNOSES  ? ?Final diagnoses:  ?Visit for suture removal  ? ? ? ?Rx / DC Orders  ? ?ED Discharge Orders   ? ? None  ? ?  ? ? ? ?Note:  This document was prepared using Dragon voice recognition software and may include unintentional dictation errors. ?  ?Jene Every, MD ?12/29/21 1432 ? ?

## 2021-12-29 NOTE — ED Triage Notes (Signed)
Pt here to have 2 stiches removed from his finger. Pt is in NAD ?

## 2022-07-07 IMAGING — CT CT PELVIS W/ CM
2 of 4 series · 15 of 46 positions shown, 17 images · IV contrast (omnipaque)
Comparison: None.

CLINICAL DATA: 24-year-old male with buttocks, Peri rectal abscess
with drainage and swelling for 1 week. Scanned prone.

EXAM:
CT PELVIS WITH CONTRAST
TECHNIQUE: Multidetector CT imaging of the pelvis was performed using the
standard protocol following the bolus administration of intravenous
contrast.
CONTRAST:  100mL OMNIPAQUE IOHEXOL 300 MG/ML  SOLN

[Series 2: axial st · axial · 0.68mm/px · z∈[-496,-276]mm · 12 of 52 slices shown, 14 images]
[im 4/52  soft-tissue]
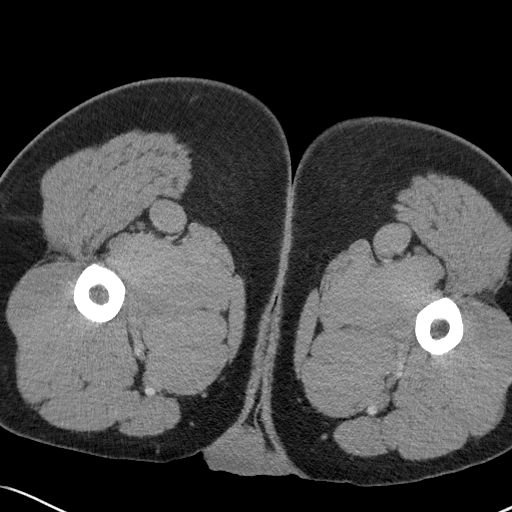
[im 4/52  bone]
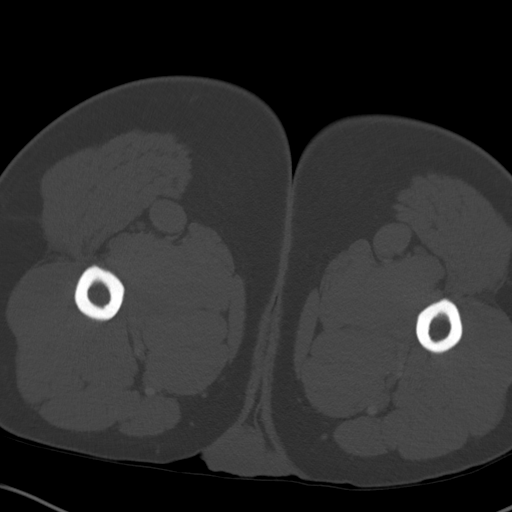
[im 7/52  soft-tissue]
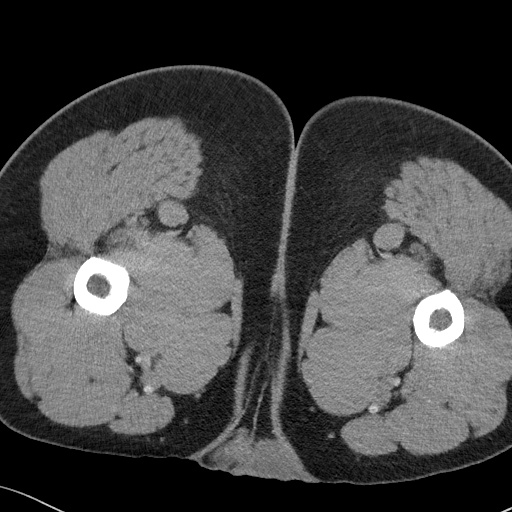
[im 11/52  soft-tissue]
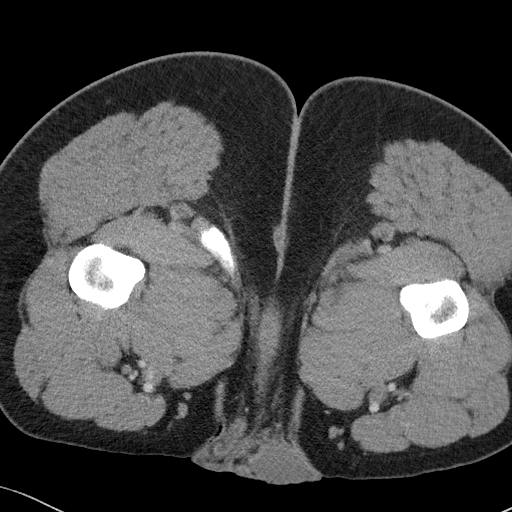
[im 18/52  soft-tissue]
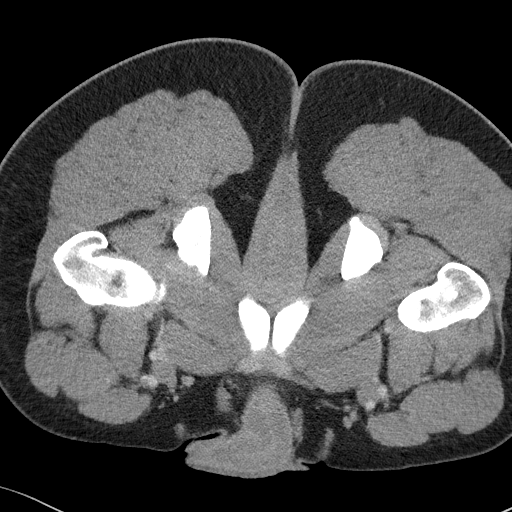
[im 21/52  soft-tissue]
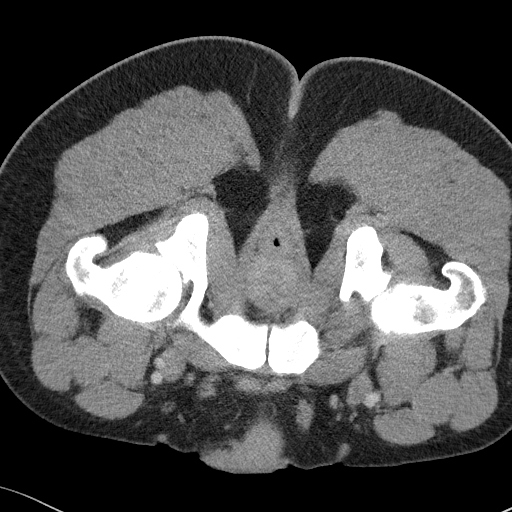
[im 24/52  soft-tissue]
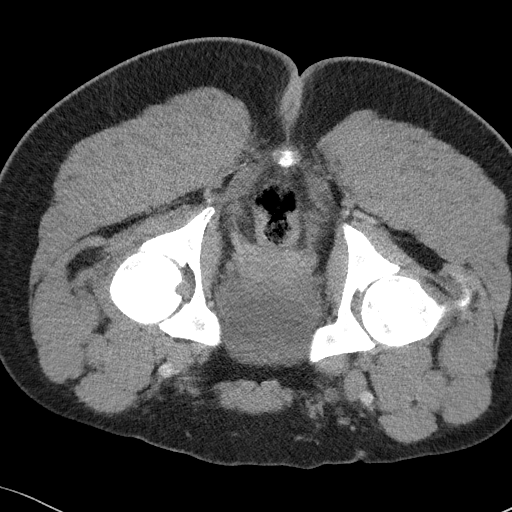
[im 28/52  soft-tissue]
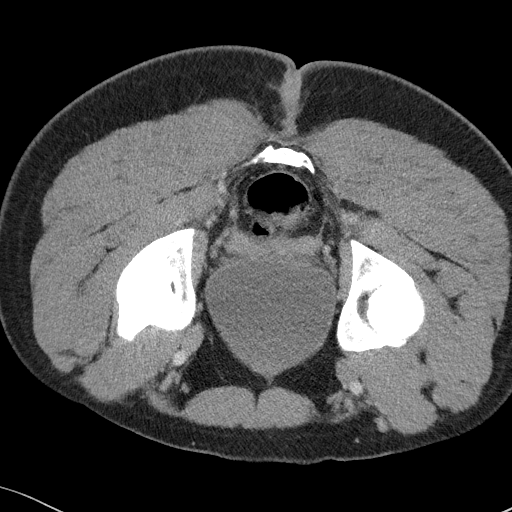
[im 31/52  soft-tissue]
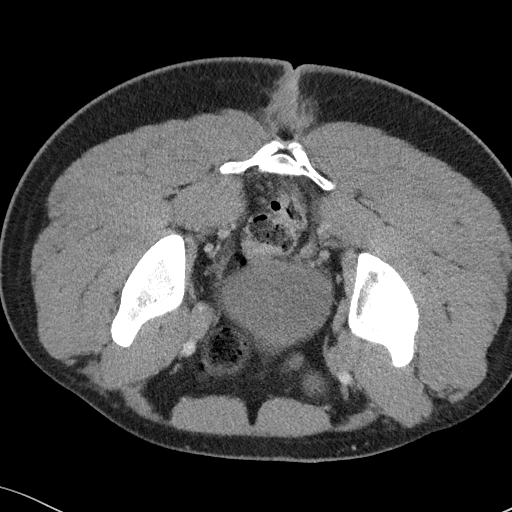
[im 35/52  soft-tissue]
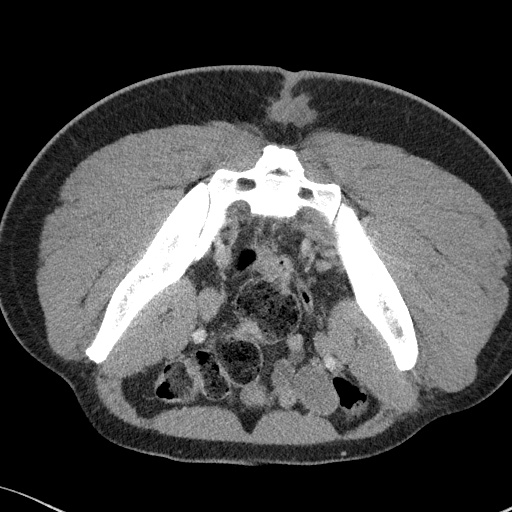
[im 35/52  bone]
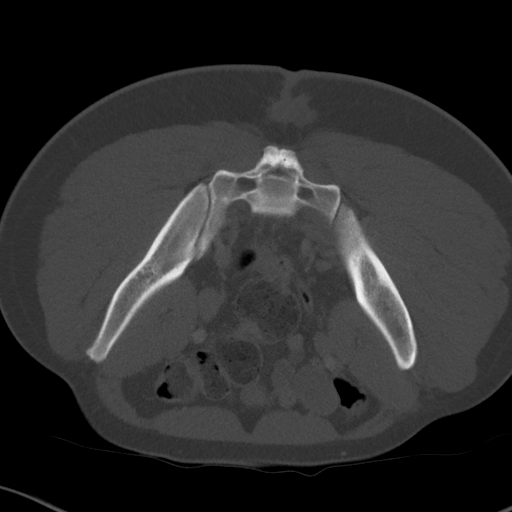
[im 41/52  soft-tissue]
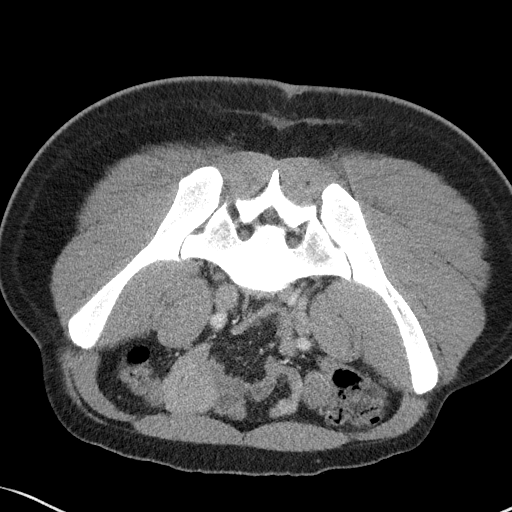
[im 45/52  soft-tissue]
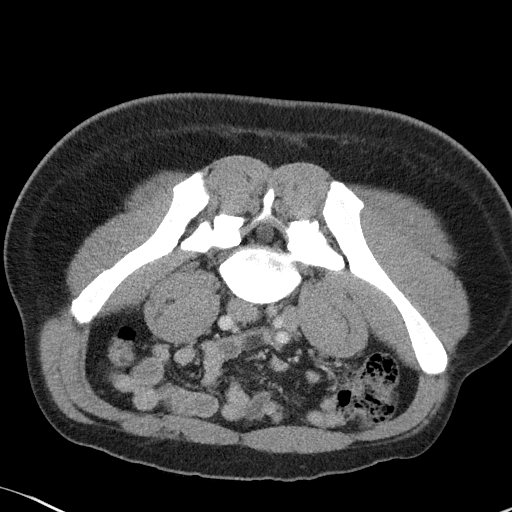
[im 48/52  soft-tissue]
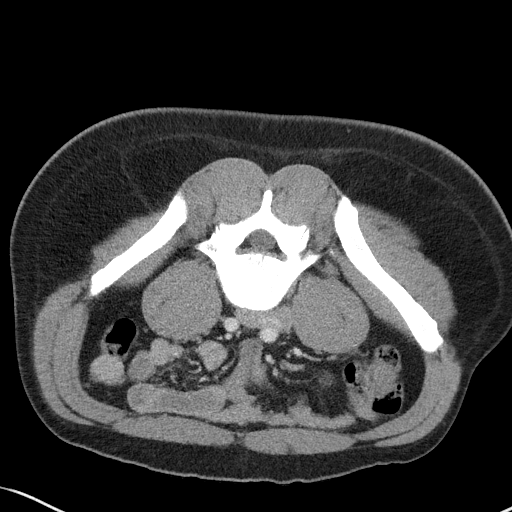

[Series 4: coronal st · coronal · 0.51mm/px · 3 of 102 slices shown]
[im 34/102  soft-tissue]
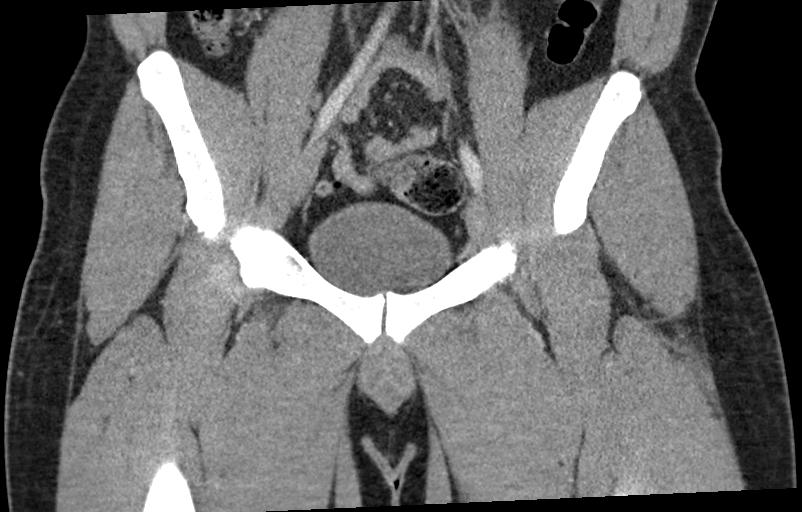
[im 45/102  soft-tissue]
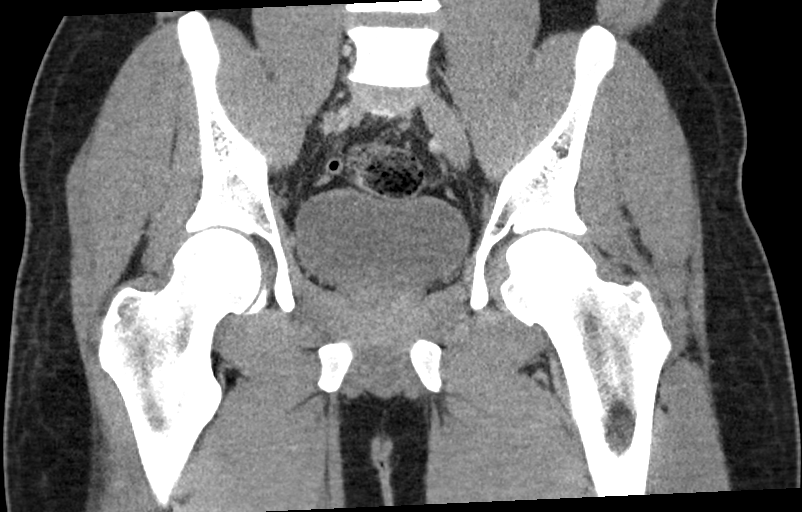
[im 57/102  soft-tissue]
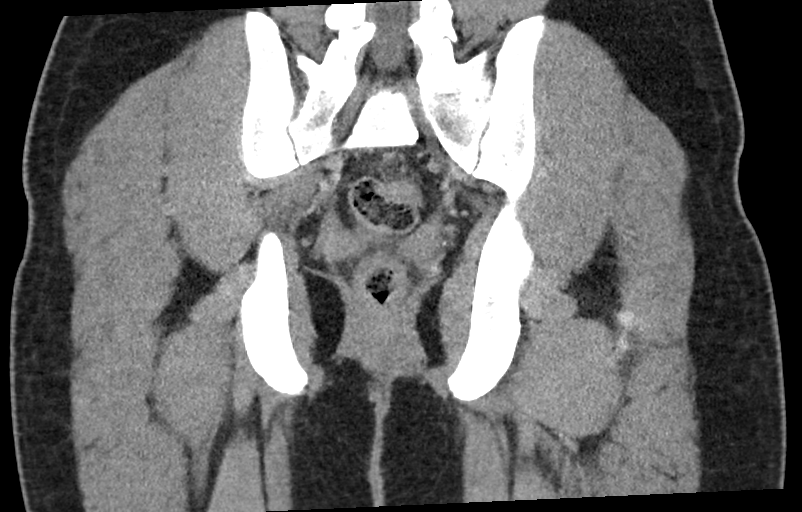

[15 of 46 positions shown; findings below may reference images not displayed]

FINDINGS: The patient was scanned prone.

Urinary Tract: Kidneys not included. Delayed postcontrast images
through the pelvis are included and demonstrate normal contrast in
the distal ureters and the urinary bladder. Ureteral jet on the
right (normal).

Bowel:  Gluteal, perianal details are below.

Otherwise negative visible large and small bowel, including normal
appendix terminating in the right pelvis on series 2, image 17.

Vascular/Lymphatic: Early phase images demonstrate patent and
normal-appearing major arterial structures. On delayed images the
pelvic central veins appear grossly patent.

No lymphadenopathy.

Reproductive: Negative; inguinal canals and scrotum appear to remain
normal.

Other:  No pelvic free fluid.

Roughly 4 cm area of confluent inflammatory stranding in the gluteal
cleft about midline.

Smaller midline roughly 20 by 8 mm fluid collection in the midline
at the level of the superior gluteal cleft (see sagittal series 5,
image 65) with rim enhancement. This is approximately 7 cm above the
anal verge, overlying the S5 and upper coccyx levels. No soft tissue
gas.

Perianal region remains within normal limits.

Musculoskeletal: Lower sacrum and coccygeal segments appear intact
without evidence of osteomyelitis.

Benign bone islands in the proximal right femur. Negative visible
lower lumbar spine.
IMPRESSION: 1. Small, midline roughly 2 cm abscess at the level of the superior
gluteal cleft, overlying the S5/coccyx level and about 7 cm cephalad
to the anal verge. See sagittal image 65.

2. Surrounding 4 cm area of regional phlegmon. No soft tissue gas or
evidence of underlying osteomyelitis.

3. Otherwise negative CT appearance of the pelvis.

## 2022-10-15 ENCOUNTER — Emergency Department (HOSPITAL_COMMUNITY): Payer: Self-pay

## 2022-10-15 ENCOUNTER — Other Ambulatory Visit: Payer: Self-pay

## 2022-10-15 ENCOUNTER — Encounter (HOSPITAL_COMMUNITY): Payer: Self-pay

## 2022-10-15 ENCOUNTER — Emergency Department (HOSPITAL_COMMUNITY)
Admission: EM | Admit: 2022-10-15 | Discharge: 2022-10-15 | Disposition: A | Discharge status: Home / Self Care | Payer: Self-pay | Attending: Emergency Medicine | Admitting: Emergency Medicine

## 2022-10-15 DIAGNOSIS — M25532 Pain in left wrist: Secondary | ICD-10-CM | POA: Insufficient documentation

## 2022-10-15 MED ORDER — NAPROXEN 375 MG PO TABS: 375.0000 mg | ORAL_TABLET | Freq: Two times a day (BID) | ORAL | 0 refills | Status: AC

## 2022-10-15 NOTE — ED Notes (Signed)
Patient left wrist placed in comfort form wrist brace for support.

## 2022-10-15 NOTE — ED Triage Notes (Signed)
Complaining of left wrist pain that started a couple of months ago. Was rough housing with a buddy and now it is hurting worse.

## 2022-10-15 NOTE — ED Provider Notes (Signed)
[facility] Provider Note   CSN: [id number] Arrival date & time: 10/15/22  1702     History  Chief Complaint  Patient presents with   Wrist Pain    Shaun Cox is a 27 y.o. male.  27 year old male presents today for evaluation of left wrist pain.  He states this has been ongoing for few months however today he had his hand extended out behind his girlfriend and she backed into it causing him to flex.  He states since then pain has been significant.  He is able to extend and flex however he is not able to laterally move his wrist.  Has not taken anything prior to arrival.  The history is provided by the patient. No language interpreter was used.       Home Medications Prior to Admission medications   Not on File      Allergies    Amoxicillin    Review of Systems   Review of Systems  Musculoskeletal:  Positive for arthralgias. Negative for joint swelling.  All other systems reviewed and are negative.   Physical Exam Updated Vital Signs BP (!) 145/84   Pulse 85   Temp 98.2 F (36.8 C) (Oral)   Ht '5\' 9"'$  (1.753 m)   Wt 104.3 kg   SpO2 100%   BMI 33.97 kg/m  Physical Exam Vitals and nursing note reviewed.  Constitutional:      General: He is not in acute distress.    Appearance: Normal appearance. He is not ill-appearing.  HENT:     Head: Normocephalic and atraumatic.     Nose: Nose normal.  Eyes:     Conjunctiva/sclera: Conjunctivae normal.  Pulmonary:     Effort: Pulmonary effort is normal. No respiratory distress.  Musculoskeletal:        General: No deformity.     Comments: Soft compartments in the left forearm.  Neurovascularly intact.  2+ radial pulse present.  Tenderness to palpation present over the left wrist, as well as anatomical snuffbox.  No visual deformity or bruising.  Skin:    Findings: No rash.  Neurological:     Mental Status: He is alert.     ED Results / Procedures / Treatments    Labs (all labs ordered are listed, but only abnormal results are displayed) Labs Reviewed - No data to display  EKG None  Radiology DG Wrist Complete Left  Result Date: 10/15/2022 CLINICAL DATA:  Left wrist pain starting 2 months ago EXAM: LEFT WRIST - COMPLETE 3+ VIEW COMPARISON:  None Available. FINDINGS: Bony ridging along the mid to distal scaphoid without a well-defined scaphoid fracture. Scapholunate angle appears normal. No definite acute bony findings. IMPRESSION: 1. Bony ridging along the mid to distal scaphoid without a well-defined scaphoid fracture. 2. If pain persists despite conservative therapy, MRI may be warranted for further characterization. Electronically Signed   By: Van Clines M.D.   On: 10/15/2022 17:28    Procedures Procedures    Medications Ordered in ED Medications - No data to display  ED Course/ Medical Decision Making/ A&P                             Medical Decision Making Amount and/or Complexity of Data Reviewed Radiology: ordered.   27 year old male presents with left wrist pain.  Neurovascularly intact.  Wrist x-ray negative for acute fracture.  Anti-inflammatories discussed for symptom management.  Will  provide thumb spica splint.  Does not have PCP.  Ortho referral given.  Patient is in agreement with this plan.  Patient is right-hand dominant.  Final Clinical Impression(s) / ED Diagnoses Final diagnoses:  Left wrist pain    Rx / DC Orders ED Discharge Orders          Ordered    naproxen (NAPROSYN) 375 MG tablet  2 times daily        10/15/22 1819              Evlyn Courier, PA-C 10/15/22 Samella Parr, MD 10/15/22 2256

## 2022-10-15 NOTE — Discharge Instructions (Addendum)
Your workup today was reassuring.  Exam was reassuring.  X-ray without evidence of fracture.  You were given a shot of Toradol which is an anti-inflammatory.  Have sent naproxen into the pharmacy for you.  This is an anti-inflammatory medication.  Do not combine this with ibuprofen.  I have given you a referral to orthopedics.  Follow-up with them as needed.

## 2024-09-10 ENCOUNTER — Emergency Department
Admission: EM | Admit: 2024-09-10 | Discharge: 2024-09-10 | Disposition: A | Discharge status: Home / Self Care | Payer: Self-pay | Attending: Emergency Medicine | Admitting: Emergency Medicine

## 2024-09-10 DIAGNOSIS — K029 Dental caries, unspecified: Secondary | ICD-10-CM | POA: Insufficient documentation

## 2024-09-10 MED ORDER — IBUPROFEN 800 MG PO TABS
800.0000 mg | ORAL_TABLET | Freq: Once | ORAL | Status: AC
  Administered 2024-09-10: 800 mg via ORAL
  Filled 2024-09-10: qty 1

## 2024-09-10 MED ORDER — OXYCODONE HCL 5 MG PO TABS: 5.0000 mg | ORAL_TABLET | Freq: Three times a day (TID) | ORAL | 0 refills | Status: AC | PRN

## 2024-09-10 MED ORDER — OXYCODONE-ACETAMINOPHEN 5-325 MG PO TABS
2.0000 | ORAL_TABLET | Freq: Once | ORAL | Status: AC
  Administered 2024-09-10: 2 via ORAL
  Filled 2024-09-10: qty 2

## 2024-09-10 MED ORDER — CLINDAMYCIN HCL 150 MG PO CAPS
300.0000 mg | ORAL_CAPSULE | Freq: Once | ORAL | Status: AC
  Administered 2024-09-10: 300 mg via ORAL
  Filled 2024-09-10: qty 2

## 2024-09-10 MED ORDER — PROBIOTIC 1-250 BILLION-MG PO CAPS: 1.0000 | ORAL_CAPSULE | Freq: Every day | ORAL | 0 refills | Status: AC

## 2024-09-10 MED ORDER — IBUPROFEN 800 MG PO TABS: 800.0000 mg | ORAL_TABLET | Freq: Three times a day (TID) | ORAL | 0 refills | Status: AC | PRN

## 2024-09-10 MED ORDER — ONDANSETRON 4 MG PO TBDP: 4.0000 mg | ORAL_TABLET | Freq: Four times a day (QID) | ORAL | 0 refills | Status: AC | PRN

## 2024-09-10 MED ORDER — CLINDAMYCIN HCL 300 MG PO CAPS: 300.0000 mg | ORAL_CAPSULE | Freq: Three times a day (TID) | ORAL | 0 refills | Status: AC

## 2024-09-10 MED ORDER — ONDANSETRON 4 MG PO TBDP
4.0000 mg | ORAL_TABLET | Freq: Once | ORAL | Status: AC
  Administered 2024-09-10: 4 mg via ORAL
  Filled 2024-09-10: qty 1

## 2024-09-10 NOTE — ED Provider Notes (Signed)
 "  [facility] Provider Note    Event Date/Time   First MD Initiated Contact with Patient 09/10/24 2210     (approximate)   History   Abscess and Dental Pain   HPI  Shaun Cox is a 29 y.o. male with no significant past medical history who presents to the emergency department with left lower dental pain ongoing for several days.  No facial swelling, difficulty swallowing, speaking or breathing.  No fever.  Does not have a radiation protection practitioner.  States he thinks he has an underlying infection.   History provided by patient, mother by phone.    No past medical history on file.  Past Surgical History:  Procedure Laterality Date   FRACTURE SURGERY     PILONIDAL CYST / SINUS EXCISION  2018   [place]     PILONIDAL CYST EXCISION N/A 07/31/2020   Procedure: CYST EXCISION PILONIDAL SIMPLE;  Surgeon: Kallie Manuelita BROCKS, MD;  Location: [facility];  Service: General;  Laterality: N/A;    MEDICATIONS:  Prior to Admission medications  Medication Sig Start Date End Date Taking? Authorizing Provider  naproxen  (NAPROSYN ) 375 MG tablet Take 1 tablet (375 mg total) by mouth 2 (two) times daily. 10/15/22   Hildegard Loge, PA-C    Physical Exam   Triage Vital Signs: ED Triage Vitals  Encounter Vitals Group     BP 09/10/24 2203 (!) 140/92     Girls Systolic BP Percentile --      Girls Diastolic BP Percentile --      Boys Systolic BP Percentile --      Boys Diastolic BP Percentile --      Pulse Rate 09/10/24 2203 71     Resp 09/10/24 2203 18     Temp 09/10/24 2203 98.1 F (36.7 C)     Temp Source 09/10/24 2203 Oral     SpO2 09/10/24 2203 97 %     Weight 09/10/24 2201 230 lb (104.3 kg)     Height 09/10/24 2201 5' 9 (1.753 m)     Head Circumference --      Peak Flow --      Pain Score 09/10/24 2200 10     Pain Loc --      Pain Education --      Exclude from Growth Chart --     Most recent vital signs: Vitals:   09/10/24 2203  BP: (!) 140/92  Pulse: 71  Resp: 18   Temp: 98.1 F (36.7 C)  SpO2: 97%    CONSTITUTIONAL: Alert, responds appropriately to questions. Well-appearing; well-nourished HEAD: Normocephalic, atraumatic EYES: Conjunctivae clear, pupils appear equal, sclera nonicteric ENT: normal nose; moist mucous membranes, patient has a tooth that is fractured all the way down to the gumline of the left lower posterior molar without any gum swelling, fluctuance, drainage, bleeding.  No trismus, drooling.  Normal phonation.  Airway patent.  No salivary gland swelling or tenderness.  Tongue sits flat in the bottom of the mouth.  No Ludwig's angina.  Trachea midline.  No cervical lymphadenopathy. NECK: Supple, normal ROM CARD: RRR; S1 and S2 appreciated RESP: Normal chest excursion without splinting or tachypnea; breath sounds clear and equal bilaterally; no wheezes, no rhonchi, no rales, no hypoxia or respiratory distress, speaking full sentences ABD/GI: Non-distended; soft, non-tender, no rebound, no guarding, no peritoneal signs BACK: The back appears normal EXT: Normal ROM in all joints; no deformity noted, no edema SKIN: Normal color for age and race; warm; no rash  on exposed skin NEURO: Moves all extremities equally, normal speech PSYCH: The patient's mood and manner are appropriate.   ED Results / Procedures / Treatments   LABS: (all labs ordered are listed, but only abnormal results are displayed) Labs Reviewed - No data to display   EKG:   RADIOLOGY: My personal review and interpretation of imaging:    I have personally reviewed all radiology reports.   No results found.   PROCEDURES:  Critical Care performed: No    Procedures    IMPRESSION / MDM / ASSESSMENT AND PLAN / ED COURSE  I reviewed the triage vital signs and the nursing notes.    Patient here with dental caries and possible developing periapical abscess.     DIFFERENTIAL DIAGNOSIS (includes but not limited to):   Dental caries, periapical abscess,  no signs of Ludwig's angina, tonsillitis, sialoadenitis, parotitis, facial cellulitis, deep space neck infection, PTA   Patient's presentation is most consistent with acute complicated illness / injury requiring diagnostic workup.   PLAN: Patient here with dental caries causing dental pain.  Will treat with clindamycin  given possibility of developing periapical abscess although no drainable abscess on exam.  No Ludwig's angina.  Airway patent.  Afebrile and nontoxic.  Will discharge with pain medication and dental follow-up information.   MEDICATIONS GIVEN IN ED: Medications  clindamycin  (CLEOCIN ) capsule 300 mg (300 mg Oral Given 09/10/24 2232)  ibuprofen  (ADVIL ) tablet 800 mg (800 mg Oral Given 09/10/24 2232)  oxyCODONE -acetaminophen  (PERCOCET/ROXICET) 5-325 MG per tablet 2 tablet (2 tablets Oral Given 09/10/24 2233)  ondansetron  (ZOFRAN -ODT) disintegrating tablet 4 mg (4 mg Oral Given 09/10/24 2232)     ED COURSE:  At this time, I do not feel there is any life-threatening condition present. I reviewed all nursing notes, vitals, pertinent previous records.  All lab and urine results, EKGs, imaging ordered have been independently reviewed and interpreted by myself.  I reviewed all available radiology reports from any imaging ordered this visit.  Based on my assessment, I feel the patient is safe to be discharged home without further emergent workup and can continue workup as an outpatient as needed. Discussed all findings, treatment plan as well as usual and customary return precautions.  They verbalize understanding and are comfortable with this plan.  Outpatient follow-up has been provided as needed.  All questions have been answered.    CONSULTS:  none   OUTSIDE RECORDS REVIEWED: Reviewed last general surgery note in 2021.       FINAL CLINICAL IMPRESSION(S) / ED DIAGNOSES   Final diagnoses:  Pain due to dental caries     Rx / DC Orders   ED Discharge Orders          Ordered     oxyCODONE  (ROXICODONE ) 5 MG immediate release tablet  Every 8 hours PRN        09/10/24 2225    ibuprofen  (ADVIL ) 800 MG tablet  Every 8 hours PRN        09/10/24 2225    ondansetron  (ZOFRAN -ODT) 4 MG disintegrating tablet  Every 6 hours PRN        09/10/24 2225    clindamycin  (CLEOCIN ) 300 MG capsule  3 times daily        09/10/24 2225    Bacillus Coagulans-Inulin (PROBIOTIC) 1-250 BILLION-MG CAPS  Daily        09/10/24 2225             Note:  This document was prepared using Dragon voice  recognition software and may include unintentional dictation errors.   Claudia Greenley, Josette SAILOR, DO 09/10/24 2308  "

## 2024-09-10 NOTE — ED Triage Notes (Addendum)
 Patient presents to the ED via EMS from home for complaints of an abscess after a tooth broke 1-2 weeks ago. Swelling noted to left side of the face. Airway intact, RR equal and unlabored. GCS of 15. Ambulatory from ambulance to room 25. Reports taking Ibuprofen  around 1630. EMS administered 650 mg Tylenol  enroute. Pain currently still 10/10.

## 2024-09-10 NOTE — Discharge Instructions (Addendum)
